# Patient Record
Sex: Female | Born: 1985 | Race: Black or African American | Hispanic: No | Marital: Single | Smoking: Never smoker
Health system: Southern US, Community
[De-identification: ages and names within clinical notes are randomized; demographics above are authoritative.]

## PROBLEM LIST (undated history)

## (undated) ENCOUNTER — Inpatient Hospital Stay (HOSPITAL_COMMUNITY): Payer: Self-pay

## (undated) DIAGNOSIS — Z789 Other specified health status: Secondary | ICD-10-CM

---

## 2007-07-07 ENCOUNTER — Inpatient Hospital Stay (HOSPITAL_COMMUNITY): Admission: AD | Admit: 2007-07-07 | Discharge: 2007-07-11 | Payer: Self-pay | Admitting: Obstetrics & Gynecology

## 2007-07-07 ENCOUNTER — Ambulatory Visit: Payer: Self-pay | Admitting: Family Medicine

## 2007-07-08 ENCOUNTER — Encounter: Payer: Self-pay | Admitting: Family Medicine

## 2007-12-04 ENCOUNTER — Emergency Department (HOSPITAL_COMMUNITY): Admission: EM | Admit: 2007-12-04 | Discharge: 2007-12-04 | Payer: Self-pay | Admitting: Emergency Medicine

## 2007-12-11 ENCOUNTER — Encounter: Payer: Self-pay | Admitting: Obstetrics & Gynecology

## 2007-12-11 ENCOUNTER — Ambulatory Visit: Payer: Self-pay | Admitting: Obstetrics & Gynecology

## 2007-12-12 ENCOUNTER — Ambulatory Visit (HOSPITAL_COMMUNITY): Admission: RE | Admit: 2007-12-12 | Discharge: 2007-12-12 | Payer: Self-pay | Admitting: Gynecology

## 2007-12-18 ENCOUNTER — Ambulatory Visit: Payer: Self-pay | Admitting: Obstetrics & Gynecology

## 2007-12-31 ENCOUNTER — Ambulatory Visit: Payer: Self-pay | Admitting: Obstetrics & Gynecology

## 2008-02-12 ENCOUNTER — Ambulatory Visit: Payer: Self-pay | Admitting: Nurse Practitioner

## 2008-10-22 ENCOUNTER — Emergency Department (HOSPITAL_COMMUNITY): Admission: EM | Admit: 2008-10-22 | Discharge: 2008-10-22 | Payer: Self-pay | Admitting: Emergency Medicine

## 2008-12-07 ENCOUNTER — Emergency Department (HOSPITAL_COMMUNITY): Admission: EM | Admit: 2008-12-07 | Discharge: 2008-12-07 | Payer: Self-pay | Admitting: Emergency Medicine

## 2010-04-07 ENCOUNTER — Encounter: Admission: RE | Admit: 2010-04-07 | Discharge: 2010-04-07 | Payer: Self-pay | Admitting: Infectious Diseases

## 2011-03-13 NOTE — Op Note (Signed)
NAME:  KARALYN, KADEL NO.:  [id number]   MEDICAL RECORD NO.:  [id number]          PATIENT TYPE:  INP   LOCATION:  9122                          FACILITY:  [facility]   PHYSICIAN:  Tanya S. Shawnie Pons, M.D.   DATE OF BIRTH:  Sep 16, 1986   DATE OF PROCEDURE:  07/08/2007  DATE OF DISCHARGE:                               OPERATIVE REPORT   PREOPERATIVE DIAGNOSES:  1. Fetal intolerance of labor.  2. Chorioamnionitis.  3. Prolonged rupture of membranes.   POSTOPERATIVE DIAGNOSES:  1. Fetal intolerance of labor.  2. Chorioamnionitis.  3. Prolonged rupture of membranes.   PROCEDURE:  Primary low transverse cesarean section.   SURGEON:  Shelbie Proctor. Shawnie Pons, M.D.   ASSISTANT:  None.   ANESTHESIA:  Epidural and local with Casimiro Needle A. Malen Gauze, MD.   SPECIMENS:  Placenta to pathology.   ESTIMATED BLOOD LOSS:  1000 mL.   COMPLICATIONS:  None.   FINDINGS:  A viable female infant, Apgars 8 and 9, pH 7.26, weight 6  pounds 13 ounces.   REASON FOR PROCEDURE:  Briefly, the patient is 25 year old gravida 1 who  presented at 40 weeks with rupture of membranes.  She had slow  progression of labor, was started on Pitocin but never really reached  adequate labor, started having some variable decelerations,  amnioinfusion was started.  The patient got to 4-5 cm and then had a  fairly large bradycardia.  At that time she was felt to be 5-6 and after  being on knee-chest, the fetal heart rate recovered.  We had the Pitocin  off for approximately 30 minutes before restarting it at 2 milliunits.  Once Pitocin was restarted she started having severe variables that did  not respond to change in position, fluid bolus or oxygen.  Although  scalp film was still very much there and it was felt that the baby  overall was reassuring, the patient had made some change from 7 to 8, it  was unclear that delivery was imminent and that repetitive variable  decelerations could eventually lead to a nonreassuring  fetal heart rate  tracing, and for this reason a decision was made to proceed with  abdominal delivery.   PROCEDURE:  The patient was taken to the OR.  She was placed in supine  position with a left lateral tilt.  She was then prepped and draped in  the usual sterile fashion.  A Foley catheter was already inside the  bladder.  When anesthesia was felt to be adequate, a knife was used to  make a Pfannenstiel incision through the skin.  This was carried down to  the underlying fascia, which was excised in the midline.  The  subcutaneous tissue and fascial incisions were then extended bluntly,  the rectus divided in the midline, the peritoneal cavity entered  bluntly.  The bladder was noted to be high, probably because the head  was blocking it.  The bladder blade was placed inside the abdomen.  A  low transverse incision was made on the uterus.  The lower uterine  segment was very thin and the infant encountered quickly.  Clear fluid  was noted.  The infant was noted to be in an ROP position.  The infant's  head was brought up and out of the incision.  The infant was bulb-  suctioned and had a spontaneous cry.  The cord was clamped x2 and the  infant taken to awaiting pediatrics.  Cord pH and cord blood were  obtained.  The placenta was manually removed from the uterus, the uterus  cleaned with dry lap pads.  The edges of the uterine incision were  grasped with ring forceps and the uterine incision closed with 0 Vicryl  suture in a locked running fashion.  A second layer of 0 Vicryl in an  imbricating fashion was used to achieve hemostasis.  There was some  bleeding at the corner on the right side of the incision.  A figure-of-  eight was used to achieve hemostasis.  Pressure was held for  approximately 2 minutes and then the incision was reinspected and was  felt to be hemostatic.  Attention was then turned to the fascia, which  was closed with 0 Vicryl suture in a running fashion.  The  subcutaneous  tissue was irrigated and any bleeders cauterized with electrocautery and  the skin closed using clips.  Marcaine 0.25% 30 mL were then injected  about the incision.  A pressure bandage was applied.  All instrument and  lap counts were correct x2.  The patient was awakened and taken to the  recovery room in stable condition.      Shelbie Proctor. Shawnie Pons, M.D.  Electronically Signed     TSP/MEDQ  D:  07/08/2007  T:  07/09/2007  Job:  [id number]

## 2011-03-13 NOTE — Assessment & Plan Note (Signed)
NAME:  Katie Love, ROMBERGER NO.:  [id number]   MEDICAL RECORD NO.:  [id number]          PATIENT TYPE:  POB   LOCATION:  [facility] at [facility]         FACILITY:  [facility]   PHYSICIAN:  Elsie Lincoln, MD      DATE OF BIRTH:  1986-07-13   DATE OF SERVICE:  12/18/2007                                  CLINIC NOTE   The patient is a 25 year old female who is here for follow-up of ovarian  cyst and IUD insertion. Patient is currently on her menstruation.  Her  EDT is negative.  The patient had an ultrasound following her CT that  was abnormal. The right ovary was normal, the left ovary complained a  complex cyst measuring 5.8 x 4.2 x 4.5 cm with evidence of a blood clot  and consistent with a hemorrhagic cyst.  Everything else was normal.  The patient will get another ultrasound in 8 weeks and then come back  for results.  She also wants an IUD today.  She was consented for the  procedure.  Cervix was cleaned and the uterus sounded to 7 cm. The  anterior lip of the cervix grasped with a single-tooth tenaculum and  Mirena IUD was inserted according to product specifications.  The  strings were cut to 2.5 cm.  The patient tolerated procedure well.  The  patient again to come back in 8 weeks after her ultrasound to evaluate  the right ovarian cyst.           ______________________________  Elsie Lincoln, MD     KL/MEDQ  D:  12/18/2007  T:  12/19/2007  Job:  [id number]

## 2011-03-13 NOTE — Assessment & Plan Note (Signed)
NAME:  Katie, Love NO.:  [id number]   MEDICAL RECORD NO.:  [id number]          PATIENT TYPE:  POB   LOCATION:  [facility] at [facility]         FACILITY:  [facility]   PHYSICIAN:  Elsie Lincoln, MD      DATE OF BIRTH:  [birth year]   DATE OF SERVICE:  12/11/2007                                  CLINIC NOTE   Patient is a 25 year old G-1, P, 1 female on November 08, 2007 who is two  days late for her menstrual period.  She presents for physical exam and  also wants a  Mirena.  Her EPT today is negative.  She usually has  normal cycles, however she is breast feeding from her recent birth in  September, so it could be irregular from that.  We will not put a Mirena  in today.  She needs to either have protected sex for two weeks and come  back for a pregnancy test and IUD insertion in two weeks, or she will  need to come in on her menses, which would be better.  The patient has  absolutely no complaints.  She is enjoying being a mother.   PAST MEDICAL HISTORY:  Denies all problems.   PAST SURGICAL HISTORY:  C-sectin.   PAST OBSTETRICAL HISTORY:  C-section x1 and wants an IUD for  contraception.  Has been using condoms until now.   FAMILY HISTORY:  Denies all problems.   SOCIAL HISTORY:  No drinking, drugs or alcohol.   ALLERGIES:  None.   MEDICATIONS:  Flintstones vitamins.   VITAL SIGNS:  Pulse 62, blood pressure 124/84, weight 137.  GENERAL:  Well-nourished, well-developed, no apparent distress.  HEENT:  Normocephalic, atraumatic, good dentition.  Thyroid no masses.  LUNGS:  Clear to auscultation bilaterally.  HEART:  Regular rate and rhythm.  BREASTS:  No masses, nontender, no lymphadenopathy.  ABDOMEN:  Soft, nontender.  No organomegaly, no hernias.  GENITALIA:  Tanner 5.  Vagina pink.  Normal rugae.  Cervix closed,  nontender.  The patient has a left ovarian mass, and at this point she  tells that she did have a visit to the emergency room and they did find  ovarian cyst.  She has apparently a 3 cc cyst on her left adnexa.  RECTUM:  No hemorrhoids.  EXTREMITIES:  Nontender.   ASSESSMENT/PLAN:  [age]-year-old female with well woman check.  1. Pap smear and cultures.  2. Transvaginal ultrasound to evaluate adnexa to see what type of      ovarian cyst she has.  3. Come back with menses or in two weeks for placement of IUD.           ______________________________  Elsie Lincoln, MD     KL/MEDQ  D:  12/11/2007  T:  12/12/2007  Job:  [id number]

## 2011-03-13 NOTE — Assessment & Plan Note (Signed)
NAME:  Katie Love, VETTER NO.:  [id number]   MEDICAL RECORD NO.:  [id number]          PATIENT TYPE:  POB   LOCATION:  [facility]         FACILITY:  [facility]   PHYSICIAN:  Elsie Lincoln, MD      DATE OF BIRTH:  [birth year]   DATE OF SERVICE:  02/12/2008                                  CLINIC NOTE   HISTORY:  The patient is a 25 year old female who presents for follow-up  of hemorrhagic cyst and IUD string check.  She had her IUD placed on  12/18/07 and the strings were cut approximately 2.5 cm.  She also is  noted to have a right probably hemorrhagic cyst.  She did miss her  follow-up ultrasound appointment which was earlier in April and she  would like to go have it rescheduled.  She has no complaints.  Her  infant is doing well.   PHYSICAL EXAMINATION:  Blood pressure 114/75, weight 139, pulse 67.  Vagina pink, normal rugae.  Cervix closed, nontender.  Uterus  anteverted, nontender.  Adnexa no masses, nontender.  Of note, there is  no IUD string seen or felt.   ASSESSMENT/PLAN:  A 26 year old female with right ovarian cyst and lack  of IUD string.  1. Transvaginal ultrasound to follow up cyst and to see if IUD is      placed correctly.  2. The patient up-to-date on PAP smear and is due in February 2010.  3. We will call the patient with the ultrasound results.           ______________________________  Elsie Lincoln, MD     KL/MEDQ  D:  02/12/2008  T:  02/12/2008  Job:  [id number]

## 2011-03-16 NOTE — Discharge Summary (Signed)
NAME:  Katie Love, Katie Love NO.:  [id number]   MEDICAL RECORD NO.:  [id number]          PATIENT TYPE:  INP   LOCATION:  9122                          FACILITY:  [facility]   PHYSICIAN:  Allie Bossier, MD        DATE OF BIRTH:  22-Aug-1986   DATE OF ADMISSION:  07/07/2007  DATE OF DISCHARGE:  07/11/2007                               DISCHARGE SUMMARY   ADMISSION DIAGNOSES:  1. Intrauterine pregnancy at 40 weeks and 1 day gestation.  2. Spontaneous rupture of membranes.   DISCHARGE DIAGNOSIS:  Postoperative day #3 status post primary low-  transverse cesarean section secondary to fetal intolerance of labor and  chorioamnionitis with delivery of viable female infant.   DISCHARGE MEDICATIONS:  1. Colace 100 milligrams orally twice a day as needed constipation.  2. Prenatal vitamins 1 tablet orally daily while breastfeeding.  3. Ibuprofen 600 milligrams orally every 6 hours as needed cramping.  4. Percocet 5/325 milligrams 1 tablet orally every 4 hours as needed      pain dispense #50 with no refills.   PROCEDURES:  Primary low-transverse cesarean section.   PRENATAL LABS:  GBS negative, rubella nonreactive, AB positive blood  type, HIV nonreactive, RPR nonreactive, antibody negative.   HOSPITAL COURSE:  Patient is a 25 year old G1, now G1 P1-0-0-1 who was  admitted at 40 weeks and 1 day gestation with spontaneous rupture of  membranes.  She was admitted to labor and delivery and subsequently was  placed on Pitocin.  Throughout the course of her labor, patient had  intermittent variable decelerations.  Due to concern that the fetus was  not tolerating labor well, in that patient was developing  chorioamnionitis, patient was taken to the operating room for cesarean  section.  She underwent a primary low-transverse cesarean section by Dr.  Shawnie Pons on July 08, 2007, and delivered a viable female infant with  Apgars of 8 at one minute and 9 at five minutes.  Infant's blood pH was  7.26, and it weighed 6 pounds 13 ounces.  Patient had estimated blood  loss of 1000 milliliters.  She tolerated the procedure well and had an  uneventful postoperative course.  Prior to discharge, she had both  ambulated and tolerated oral intake well.  She also had a bowel  movement.  Her pain was well controlled with Percocet.  Patient plans on  breastfeeding and received Depo-Provera for contraception.  Her Depo-  Provera injection was on July 11, 2007.  On postoperative day #3,  patient's staples were removed, and she was deemed stable for discharge.   DISCHARGE INSTRUCTIONS:  1. Nothing in vagina times 6 weeks.  2. No heavy lifting times 6 weeks.  3. Take medications as previously outlined above.  4. Follow up at the [facility] Department in 6 weeks for      postpartum check.   DISCHARGE CONDITION:  Patient was discharged in stable condition to home  with her infant.      Lauro Franklin, MD      Allie Bossier, MD  Electronically Signed    TCB/MEDQ  D:  07/12/2007  T:  07/12/2007  Job:  [id number]

## 2011-03-20 ENCOUNTER — Other Ambulatory Visit: Payer: Self-pay | Admitting: Obstetrics & Gynecology

## 2011-03-20 ENCOUNTER — Ambulatory Visit (INDEPENDENT_AMBULATORY_CARE_PROVIDER_SITE_OTHER): Payer: BC Managed Care – PPO | Admitting: Obstetrics & Gynecology

## 2011-03-20 DIAGNOSIS — Z113 Encounter for screening for infections with a predominantly sexual mode of transmission: Secondary | ICD-10-CM

## 2011-03-20 DIAGNOSIS — Z1272 Encounter for screening for malignant neoplasm of vagina: Secondary | ICD-10-CM

## 2011-03-20 DIAGNOSIS — N926 Irregular menstruation, unspecified: Secondary | ICD-10-CM

## 2011-03-20 DIAGNOSIS — N946 Dysmenorrhea, unspecified: Secondary | ICD-10-CM

## 2011-03-20 DIAGNOSIS — Z01419 Encounter for gynecological examination (general) (routine) without abnormal findings: Secondary | ICD-10-CM

## 2011-03-22 NOTE — Assessment & Plan Note (Signed)
NAME:  Katie Love, Katie Love NO.:  [id number]  MEDICAL RECORD NO.:  [id number]           PATIENT TYPE:  LOCATION:  [facility] at [place]           FACILITY:  PHYSICIAN:  Elsie Lincoln, MD           DATE OF BIRTH:  DATE OF SERVICE:  03/20/2011                                 CLINIC NOTE  The patient is a 25 year old G1, P50 female who presents for her yearly exam.  I have seen her at [facility] office but that was in 2009.  The patient lost insurance and she went back and got her LPN.  She works in [employer] which is on [address].  The patient had an IUD placed, but the strings are now gone.  She also has a history of an ovarian cyst that appears to be mostly hemorrhagic on the left ovary.  Again, she did not get followup due to insurance issues.  The patient is complaining of dyspareunia for the past 3 months.  It is mostly with deep penetration and also sometimes about halfway in.  It does not hurt insertionally. The patient also will get a foul discharge occasionally and uses Vagisil to clear it up.  PAST MEDICAL HISTORY:  Denies all problems.  PAST SURGICAL HISTORY:  C-section.  OBSTETRICAL HISTORY:  C-section x1 with an IUD in situ.  The patient questionably had an abnormal Pap smear and had a colpo, but never had any treatment.  She said it cleared up.  There is no history of endometriosis or fibroid tumors.  There is a history of ovarian cyst on the left back in 2009.  No history of sexually transmitted diseases.  MEDICATIONS:  None.  ALLERGIES:  None.  SOCIAL HISTORY:  She is employed as described above.  FAMILY HISTORY:  Negative for diabetes, heart disease, high blood pressure, breast cancer, colon cancer, ovarian cancer, uterine cancer, blood clot in the legs or lungs.  REVIEW OF SYSTEMS:  Systemic review is positive for vaginal discharge and pain with intercourse.  PHYSICAL EXAMINATION:  VITAL SIGNS:  Pulse 82, blood pressure  125/77, weight 141, height 61 inches. GENERAL:  Well nourished, well developed no apparent distress. HEENT:  Normocephalic, atraumatic.  Good dentition. THYROID AND NECK:  No masses. LUNGS:  Clear auscultation bilaterally. HEART:  Regular rate and rhythm. BREASTS:  No masses, nontender.  No lymphadenopathy.  No nipple discharge. ABDOMEN:  Soft, nontender.  No rebound or guarding.  No organomegaly. No hernia.  GENITALIA:  Tanner V.  Vagina pink, normal rugae.  Cervix closed, nontender.  No IUD strings seen.  Uterus feels anteverted.  The ovaries are not well felt due to good musculature and abdominal wall, but no major mass felt either.  The exam is nontender.  We also did a Q- tip exam at the vulva and there was no pain on the vulva or the vestibule.  No cystocele, no rectocele.  No hemorrhoids. EXTREMITIES:  Nontender.  ASSESSMENT/PLAN:  A 25 year old female for well-woman exam and also complaining of dyspareunia and occasional vaginal discharge. 1. Pap smear done. 2. GC, Chlamydia and wet prep done. 3. EPT for oligomenorrhea, most likely due to the IUD. 4. Transvaginal ultrasound ordered to  evaluate a history of left     ovarian cyst and IUD placement, also evaluating the other pelvic     pathology. 5. No more use of Vagisil. 6. Return to clinic in 2-3 weeks for results. 7. Reviewed using lubricant at all times because she does not     lubricate on her own.  The patient was also told to stop having sex     when became painful.  Her husband now knows it is painful and he is     going to stop.          ______________________________ Elsie Lincoln, MD    KL/MEDQ  D:  03/20/2011  T:  03/21/2011  Job:  [id number]

## 2011-05-04 ENCOUNTER — Other Ambulatory Visit: Payer: Self-pay | Admitting: Obstetrics & Gynecology

## 2011-05-04 DIAGNOSIS — Z975 Presence of (intrauterine) contraceptive device: Secondary | ICD-10-CM

## 2011-05-04 DIAGNOSIS — N83209 Unspecified ovarian cyst, unspecified side: Secondary | ICD-10-CM

## 2011-05-07 ENCOUNTER — Other Ambulatory Visit: Payer: BC Managed Care – PPO

## 2011-05-14 ENCOUNTER — Ambulatory Visit
Admission: RE | Admit: 2011-05-14 | Discharge: 2011-05-14 | Disposition: A | Discharge status: Home / Self Care | Payer: BC Managed Care – PPO | Source: Ambulatory Visit | Attending: Obstetrics & Gynecology | Admitting: Obstetrics & Gynecology

## 2011-05-14 DIAGNOSIS — Z975 Presence of (intrauterine) contraceptive device: Secondary | ICD-10-CM

## 2011-05-14 DIAGNOSIS — N83209 Unspecified ovarian cyst, unspecified side: Secondary | ICD-10-CM

## 2011-07-20 LAB — I-STAT 8, (EC8 V) (CONVERTED LAB)
BUN: 9
Bicarbonate: 25.2 — ABNORMAL HIGH
Glucose, Bld: 89
HCT: 42
Operator id: 294501
Potassium: 3.9
Sodium: 137
TCO2: 26
pCO2, Ven: 40 — ABNORMAL LOW
pH, Ven: 7.408 — ABNORMAL HIGH

## 2011-07-20 LAB — DIFFERENTIAL
Eosinophils Absolute: 0.1
Eosinophils Relative: 2
Neutrophils Relative %: 37 — ABNORMAL LOW

## 2011-07-20 LAB — LIPASE, BLOOD: Lipase: 32

## 2011-07-20 LAB — CBC
HCT: 38.4
MCV: 90
RBC: 4.26
WBC: 5.2

## 2011-07-20 LAB — HEPATIC FUNCTION PANEL
AST: 18
Albumin: 4.1
Alkaline Phosphatase: 69
Total Bilirubin: 1.1

## 2011-07-20 LAB — URINALYSIS, ROUTINE W REFLEX MICROSCOPIC
Bilirubin Urine: NEGATIVE
Hgb urine dipstick: NEGATIVE
Specific Gravity, Urine: 1.02
pH: 6

## 2011-07-20 LAB — URINE MICROSCOPIC-ADD ON

## 2011-08-10 LAB — URINE MICROSCOPIC-ADD ON

## 2011-08-10 LAB — CBC
MCHC: 34.5
MCV: 92.6
Platelets: 196
RDW: 13.3
WBC: 18.3 — ABNORMAL HIGH

## 2011-08-10 LAB — URINALYSIS, ROUTINE W REFLEX MICROSCOPIC
Ketones, ur: NEGATIVE
Leukocytes, UA: NEGATIVE
Specific Gravity, Urine: 1.01
pH: 7

## 2011-08-10 LAB — RPR: RPR Ser Ql: NONREACTIVE

## 2011-09-12 ENCOUNTER — Ambulatory Visit (INDEPENDENT_AMBULATORY_CARE_PROVIDER_SITE_OTHER): Payer: BC Managed Care – PPO | Admitting: Obstetrics & Gynecology

## 2011-09-12 ENCOUNTER — Encounter: Payer: Self-pay | Admitting: Obstetrics & Gynecology

## 2011-09-12 VITALS — BP 115/78 | HR 59 | Temp 98.0°F | Resp 15 | Ht 61.0 in | Wt 143.0 lb

## 2011-09-12 DIAGNOSIS — N898 Other specified noninflammatory disorders of vagina: Secondary | ICD-10-CM

## 2011-09-12 DIAGNOSIS — IMO0001 Reserved for inherently not codable concepts without codable children: Secondary | ICD-10-CM | POA: Insufficient documentation

## 2011-09-12 MED ORDER — FLUCONAZOLE 100 MG PO TABS: 150.0000 mg | ORAL_TABLET | Freq: Every day | ORAL | Status: AC

## 2011-09-12 NOTE — Progress Notes (Signed)
  Subjective:    Patient ID: Katie Love, female    DOB: 04-08-1986, 25 y.o.   MRN: [id number]  HPI Pt has been having vaginal itching and burning for a few days.  Pt tried an over the counter ([facility] brand) of Vagisil type cream.  Pt has no complaints of irreg bleeding or pelvic pain.  Pt wants to have Mirena out at some point b/c she thinks it is contributing to her vaginal symptoms   Review of Systems  Constitutional: Negative.   Gastrointestinal: Negative.   Genitourinary: Negative for dysuria.       Vaginal burining / itching       Objective:   Physical Exam  Constitutional: She appears well-developed and well-nourished. No distress.  HENT:  Head: Normocephalic and atraumatic.  Eyes: Conjunctivae are normal.  Genitourinary: Uterus normal. Vaginal discharge found.       Minimal amount of discharge IUD strings not seen, but IUD in place on recent US  Skin: Skin is warm and dry.  Psychiatric: She has a normal mood and affect.          Assessment & Plan:  25 yo female with presumed vaginal candidiasis  1-treat with diflucan 2-wet prep sent 3-RTC prn or for IUD removal

## 2011-09-13 ENCOUNTER — Other Ambulatory Visit: Payer: Self-pay | Admitting: Obstetrics & Gynecology

## 2011-09-13 LAB — WET PREP FOR TRICH, YEAST, CLUE: WBC, Wet Prep HPF POC: NONE SEEN

## 2011-09-13 LAB — WET PREP, GENITAL: Trich, Wet Prep: NONE SEEN

## 2011-09-16 ENCOUNTER — Other Ambulatory Visit: Payer: Self-pay | Admitting: Obstetrics & Gynecology

## 2011-09-16 MED ORDER — METRONIDAZOLE 500 MG PO TABS: 500.0000 mg | ORAL_TABLET | Freq: Two times a day (BID) | ORAL | Status: AC

## 2011-09-17 NOTE — Progress Notes (Signed)
LM on home number that her wet prep showed BV and she had a RX @ her pharmacy for Flagyl 500mg  BID x 7 days.  This was escribed by Dr Penne Lash.

## 2011-09-26 ENCOUNTER — Ambulatory Visit: Payer: BC Managed Care – PPO | Admitting: Obstetrics & Gynecology

## 2011-10-03 ENCOUNTER — Encounter: Payer: Self-pay | Admitting: Obstetrics & Gynecology

## 2011-10-03 ENCOUNTER — Ambulatory Visit (INDEPENDENT_AMBULATORY_CARE_PROVIDER_SITE_OTHER): Payer: BC Managed Care – PPO | Admitting: Obstetrics & Gynecology

## 2011-10-03 VITALS — BP 118/81 | HR 56 | Temp 97.6°F | Resp 16 | Ht 61.0 in | Wt 145.0 lb

## 2011-10-03 DIAGNOSIS — N898 Other specified noninflammatory disorders of vagina: Secondary | ICD-10-CM

## 2011-10-03 DIAGNOSIS — N899 Noninflammatory disorder of vagina, unspecified: Secondary | ICD-10-CM

## 2011-10-03 MED ORDER — CLOBETASOL PROPIONATE 0.05 % EX OINT: TOPICAL_OINTMENT | Freq: Two times a day (BID) | CUTANEOUS | Status: DC

## 2011-10-03 MED ORDER — FLUCONAZOLE 100 MG PO TABS: 150.0000 mg | ORAL_TABLET | Freq: Every day | ORAL | Status: AC

## 2011-10-03 NOTE — Progress Notes (Signed)
  Subjective:    Patient ID: Katie Love, female    DOB: September 07, 1986, 25 y.o.   MRN: [id number]  HPI  Pt complaining of continued itching on vulva.  Mostly labia minora.  Pt uses Suave soap, Tide detergent, and KY jelly.  Pt got some relief from diflucan.  No relief from flagyl.  Pt thinks it is the Mirena that is causing the itching  Review of Systems  Respiratory: Negative.   Cardiovascular: Negative.   Gastrointestinal: Negative.   Genitourinary:       Vaginal itching       Objective:   Physical Exam  Constitutional: She is oriented to person, place, and time. She appears well-developed and well-nourished. No distress.  HENT:  Head: Normocephalic and atraumatic.  Abdominal: Soft.  Genitourinary: Vagina normal.  Neurological: She is alert and oriented to person, place, and time.  Skin: Skin is warm and dry.  Psychiatric: She has a normal mood and affect.          Assessment & Plan:  Vaginal itching  Will treat with clobetasol ointment bid for a week then daily for a week. Diflucan x1 Cetaphil soap Change lubricant to astroglide Dye free and perfume free detergent.  RTC 2 weeks.  If not better may remove IUD

## 2011-10-03 NOTE — Patient Instructions (Signed)
Use Astroglide for lubricant  Wash with Cetaphil soap.  Change detergent to Tide Free  No douching or other creams other than those prescribed by the MD.

## 2011-10-04 LAB — WET PREP BY MOLECULAR PROBE
Candida species: POSITIVE — AB
Trichomonas vaginosis: NEGATIVE

## 2011-10-10 ENCOUNTER — Telehealth: Payer: Self-pay | Admitting: *Deleted

## 2011-10-10 NOTE — Telephone Encounter (Signed)
Left message to inform pt that she did have vaginal yeast and to see if she was feeling better after taking the Diflucan and that if she wasn't we would retreat her per Dr Penne Lash.

## 2011-11-19 ENCOUNTER — Other Ambulatory Visit: Payer: Self-pay | Admitting: Obstetrics & Gynecology

## 2011-11-19 ENCOUNTER — Telehealth: Payer: Self-pay

## 2011-11-19 MED ORDER — FLUCONAZOLE 100 MG PO TABS: 150.0000 mg | ORAL_TABLET | Freq: Every day | ORAL | Status: AC

## 2012-01-09 ENCOUNTER — Other Ambulatory Visit: Payer: Self-pay | Admitting: Obstetrics & Gynecology

## 2012-01-09 MED ORDER — ATOVAQUONE-PROGUANIL HCL 250-100 MG PO TABS: 1.0000 | ORAL_TABLET | Freq: Every day | ORAL | Status: DC

## 2012-03-25 ENCOUNTER — Encounter: Payer: Self-pay | Admitting: Obstetrics & Gynecology

## 2012-03-25 ENCOUNTER — Ambulatory Visit (INDEPENDENT_AMBULATORY_CARE_PROVIDER_SITE_OTHER): Payer: BC Managed Care – PPO | Admitting: Obstetrics & Gynecology

## 2012-03-25 VITALS — BP 118/78 | HR 63 | Ht 62.0 in | Wt 145.0 lb

## 2012-03-25 DIAGNOSIS — Z30432 Encounter for removal of intrauterine contraceptive device: Secondary | ICD-10-CM

## 2012-03-25 DIAGNOSIS — N760 Acute vaginitis: Secondary | ICD-10-CM | POA: Insufficient documentation

## 2012-03-25 NOTE — Progress Notes (Signed)
Patient is here to have Mirena IUD removed due to increase in vaginal infections.

## 2012-03-25 NOTE — Progress Notes (Signed)
  Subjective:    Patient ID: Katie Love, female    DOB: [birth year], 26 y.o.   MRN: [id number]  HPI  Pt presents for IUD removal.  Pt thinks it is causing vaginitis.  Pt is OK with becoming pregnant.  Pt has one child and he is having speech delay.  Review of Systems  Gastrointestinal: Negative.   Genitourinary: Negative.        Objective:   Physical Exam  Constitutional: She appears well-developed and well-nourished.  HENT:  Head: Normocephalic and atraumatic.  Pulmonary/Chest: Effort normal.  Abdominal: She exhibits no distension and no mass. There is no tenderness. There is no rebound and no guarding.  Genitourinary: Vagina normal and uterus normal.  Skin: Skin is warm and dry.  Psychiatric: She has a normal mood and affect.          Assessment & Plan:  IUD removal  Consent obtained. No strings identified. IUD confirmed with bedside US Tresa Endo used to grasp strings just inside the os and IUD removed without problem Pap smear up to date (cytology and HPV neg). Needs primary care MD--Dr. Ruthe Mannan recommended. RTC prn

## 2012-03-25 NOTE — Patient Instructions (Signed)
Contraception Choices Contraception (birth control) is the use of any methods or devices to prevent pregnancy. Below are some methods to help avoid pregnancy. HORMONAL METHODS   Contraceptive implant. This is a thin, plastic tube containing progesterone hormone. It does not contain estrogen hormone. Your caregiver inserts the tube in the inner part of the upper arm. The tube can remain in place for up to 3 years. After 3 years, the implant must be removed. The implant prevents the ovaries from releasing an egg (ovulation), thickens the cervical mucus which prevents sperm from entering the uterus, and thins the lining of the inside of the uterus.   Progesterone-only injections. These injections are given every 3 months by your caregiver to prevent pregnancy. This synthetic progesterone hormone stops the ovaries from releasing eggs. It also thickens cervical mucus and changes the uterine lining. This makes it harder for sperm to survive in the uterus.   Birth control pills. These pills contain estrogen and progesterone hormone. They work by stopping the egg from forming in the ovary (ovulation). Birth control pills are prescribed by a caregiver.Birth control pills can also be used to treat heavy periods.   Minipill. This type of birth control pill contains only the progesterone hormone. They are taken every day of each month and must be prescribed by your caregiver.   Birth control patch. The patch contains hormones similar to those in birth control pills. It must be changed once a week and is prescribed by a caregiver.   Vaginal ring. The ring contains hormones similar to those in birth control pills. It is left in the vagina for 3 weeks, removed for 1 week, and then a new one is put back in place. The patient must be comfortable inserting and removing the ring from the vagina.A caregiver's prescription is necessary.   Emergency contraception. Emergency contraceptives prevent pregnancy after  unprotected sexual intercourse. This pill can be taken right after sex or up to 5 days after unprotected sex. It is most effective the sooner you take the pills after having sexual intercourse. Emergency contraceptive pills are available without a prescription. Check with your pharmacist. Do not use emergency contraception as your only form of birth control.  BARRIER METHODS   Female condom. This is a thin sheath (latex or rubber) that is worn over the penis during sexual intercourse. It can be used with spermicide to increase effectiveness.   Female condom. This is a soft, loose-fitting sheath that is put into the vagina before sexual intercourse.   Diaphragm. This is a soft, latex, dome-shaped barrier that must be fitted by a caregiver. It is inserted into the vagina, along with a spermicidal jelly. It is inserted before intercourse. The diaphragm should be left in the vagina for 6 to 8 hours after intercourse.   Cervical cap. This is a round, soft, latex or plastic cup that fits over the cervix and must be fitted by a caregiver. The cap can be left in place for up to 48 hours after intercourse.   Sponge. This is a soft, circular piece of polyurethane foam. The sponge has spermicide in it. It is inserted into the vagina after wetting it and before sexual intercourse.   Spermicides. These are chemicals that kill or block sperm from entering the cervix and uterus. They come in the form of creams, jellies, suppositories, foam, or tablets. They do not require a prescription. They are inserted into the vagina with an applicator before having sexual intercourse. The process must be   repeated every time you have sexual intercourse.  INTRAUTERINE CONTRACEPTION  Intrauterine device (IUD). This is a T-shaped device that is put in a woman's uterus during a menstrual period to prevent pregnancy. There are 2 types:   Copper IUD. This type of IUD is wrapped in copper wire and is placed inside the uterus. Copper  makes the uterus and fallopian tubes produce a fluid that kills sperm. It can stay in place for 10 years.   Hormone IUD. This type of IUD contains the hormone progestin (synthetic progesterone). The hormone thickens the cervical mucus and prevents sperm from entering the uterus, and it also thins the uterine lining to prevent implantation of a fertilized egg. The hormone can weaken or kill the sperm that get into the uterus. It can stay in place for 5 years.  PERMANENT METHODS OF CONTRACEPTION  Female tubal ligation. This is when the woman's fallopian tubes are surgically sealed, tied, or blocked to prevent the egg from traveling to the uterus.   Female sterilization. This is when the female has the tubes that carry sperm tied off (vasectomy).This blocks sperm from entering the vagina during sexual intercourse. After the procedure, the man can still ejaculate fluid (semen).  NATURAL PLANNING METHODS  Natural family planning. This is not having sexual intercourse or using a barrier method (condom, diaphragm, cervical cap) on days the woman could become pregnant.   Calendar method. This is keeping track of the length of each menstrual cycle and identifying when you are fertile.   Ovulation method. This is avoiding sexual intercourse during ovulation.   Symptothermal method. This is avoiding sexual intercourse during ovulation, using a thermometer and ovulation symptoms.   Post-ovulation method. This is timing sexual intercourse after you have ovulated.  Regardless of which type or method of contraception you choose, it is important that you use condoms to protect against the transmission of sexually transmitted diseases (STDs). Talk with your caregiver about which form of contraception is most appropriate for you. Document Released: 10/15/2005 Document Revised: 10/04/2011 Document Reviewed: 02/21/2011 Exit[employer] Patient Information 2012 [employer]. 

## 2012-05-15 ENCOUNTER — Ambulatory Visit: Payer: BC Managed Care – PPO | Admitting: Obstetrics & Gynecology

## 2012-05-21 ENCOUNTER — Encounter: Payer: Self-pay | Admitting: Obstetrics & Gynecology

## 2012-05-21 ENCOUNTER — Ambulatory Visit (INDEPENDENT_AMBULATORY_CARE_PROVIDER_SITE_OTHER): Payer: BC Managed Care – PPO | Admitting: Obstetrics & Gynecology

## 2012-05-21 VITALS — BP 104/65 | HR 62 | Temp 98.5°F | Resp 16 | Ht 61.0 in | Wt 147.0 lb

## 2012-05-21 DIAGNOSIS — N912 Amenorrhea, unspecified: Secondary | ICD-10-CM

## 2012-05-21 DIAGNOSIS — N760 Acute vaginitis: Secondary | ICD-10-CM

## 2012-05-21 NOTE — Progress Notes (Signed)
  Subjective:    Patient ID: Katie Love, female    DOB: July 20, 1986, 26 y.o.   MRN: [id number]  HPI  Pt had IUD remove the end of May.  Pt has been having unprotected sex.  UPT positive.  Review of Systems Pt denies bleeding, abdominal pain. Pt does have symptoms of GERD     Objective:   Physical Exam  Bedside US done showing IUP at 6 weeks 4 days.   Pt needs new OB and will schedule first screen and have official US at that time     Assessment & Plan:  26 yo female with 6 week 4 day IUP Continue folic acid F/u 3-4 weeks.

## 2012-05-23 ENCOUNTER — Telehealth: Payer: Self-pay | Admitting: *Deleted

## 2012-05-23 NOTE — Telephone Encounter (Signed)
Pt called and left message on voice mail that she is bleeding.  Pt is pregnant and had a confirmed IUP by Dr Penne Lash by bedside U/S on Wed.  Returned call to pt and advised if she is having light spotting or brown when she wipes that this could be normal but if she is bleeding like a period she should go to the MAU for evaluation.  We do not have her blood type.  This was left on her phone voicemail.

## 2012-05-26 ENCOUNTER — Inpatient Hospital Stay (HOSPITAL_COMMUNITY)
Admission: AD | Admit: 2012-05-26 | Discharge: 2012-05-26 | Disposition: A | Discharge status: Hospice | Payer: BC Managed Care – PPO | Source: Ambulatory Visit | Attending: Family Medicine | Admitting: Family Medicine

## 2012-05-26 ENCOUNTER — Inpatient Hospital Stay (HOSPITAL_COMMUNITY): Payer: BC Managed Care – PPO

## 2012-05-26 ENCOUNTER — Encounter (HOSPITAL_COMMUNITY): Payer: Self-pay | Admitting: *Deleted

## 2012-05-26 DIAGNOSIS — O209 Hemorrhage in early pregnancy, unspecified: Secondary | ICD-10-CM | POA: Insufficient documentation

## 2012-05-26 HISTORY — DX: Other specified health status: Z78.9

## 2012-05-26 LAB — CBC WITH DIFFERENTIAL/PLATELET
Hemoglobin: 11.2 g/dL — ABNORMAL LOW (ref 12.0–15.0)
Lymphocytes Relative: 45 % (ref 12–46)
Lymphs Abs: 2.7 10*3/uL (ref 0.7–4.0)
MCH: 30 pg (ref 26.0–34.0)
Monocytes Relative: 7 % (ref 3–12)
Neutro Abs: 2.9 10*3/uL (ref 1.7–7.7)
Neutrophils Relative %: 47 % (ref 43–77)
RBC: 3.73 MIL/uL — ABNORMAL LOW (ref 3.87–5.11)
WBC: 6.1 10*3/uL (ref 4.0–10.5)

## 2012-05-26 LAB — OB RESULTS CONSOLE ABO/RH

## 2012-05-26 LAB — ABO/RH: ABO/RH(D): AB POS

## 2012-05-26 LAB — WET PREP, GENITAL

## 2012-05-26 NOTE — MAU Note (Signed)
Pt states she has pink/red bleeding since last Thursday.  Today is brown discharge.  She says she had intermittant cramping, but none now.

## 2012-05-26 NOTE — MAU Provider Note (Signed)
History     CSN: [id number]  Arrival date and time: 05/26/12 1616   First Provider Initiated Contact with Patient 05/26/12 1853      Chief Complaint  Patient presents with  . Vaginal Bleeding   HPI Katie Love is a 26 y.o. female who presents to MAU for vaginal bleeding in early pregnancy. LMP unsure. Had been using marina IUD for birth control. Was placed 4 years ago by Dr. Penne Lash. Removed 5/28 and has not had a period. Started bleeding 3 days ago. Describes the bleeding as less than a period. Associated symptoms include lower abdominal cramping and nausea. 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 The history was provided by the patient.  OB History    Grav Para Term Preterm Abortions TAB SAB Ect Mult Living   2 1 1       1       Past Medical History  Diagnosis Date  . No pertinent past medical history     Past Surgical History  Procedure Date  . Cesarean section 2008    History reviewed. No pertinent family history.  History  Substance Use Topics  . Smoking status: Never Smoker   . Smokeless tobacco: Never Used  . Alcohol Use: No    Allergies: No Known Allergies  Prescriptions prior to admission  Medication Sig Dispense Refill  . folic acid (FOLVITE) 1 MG tablet Take 1 mg by mouth daily.        Review of Systems  Constitutional: Negative for fever, chills and weight loss.  HENT: Negative for ear pain, nosebleeds, congestion, sore throat and neck pain.   Eyes: Negative for blurred vision, double vision, photophobia and pain.  Respiratory: Negative for cough, shortness of breath and wheezing.   Cardiovascular: Negative for chest pain, palpitations and leg swelling.  Gastrointestinal: Positive for nausea, vomiting and abdominal pain. Negative for heartburn, diarrhea and constipation.  Genitourinary: Positive for frequency. Negative for dysuria and urgency.  Musculoskeletal: Negative for myalgias and back pain.  Skin:  Negative for itching and rash.  Neurological: Positive for dizziness and headaches. Negative for sensory change, speech change, seizures and weakness.  Endo/Heme/Allergies: Does not bruise/bleed easily.  Psychiatric/Behavioral: Negative for depression. The patient is not nervous/anxious.    Physical Exam   Blood pressure 102/63, pulse 60, temperature 98.5 F (36.9 C), temperature source Oral, resp. rate 18, height 5' 2.5" (1.588 m), weight 148 lb (67.132 kg).  Physical Exam  Nursing note and vitals reviewed. Constitutional: She is oriented to person, place, and time. She appears well-developed and well-nourished. No distress.  HENT:  Head: Normocephalic and atraumatic.  Eyes: EOM are normal.  Neck: Neck supple.  Cardiovascular: Normal rate.   Respiratory: Effort normal.  GI: Soft. There is no tenderness.  Genitourinary:       External genitalia without lesions. Scant blood vaginal vault. Cervix long, closed. No CMT, no adnexal tenderness or mass palpated. Uterus slightly enlarged.   Musculoskeletal: Normal range of motion.  Neurological: She is alert and oriented to person, place, and time.  Skin: Skin is warm and dry.  Psychiatric: She has a normal mood and affect. Her behavior is normal. Judgment and thought content normal.   Results for orders placed during the hospital encounter of 05/26/12 (from the past 24 hour(s))  HCG, QUANTITATIVE, PREGNANCY     Status: Abnormal   Collection Time   05/26/12  5:18 PM      Component Value Range  hCG, Beta [place], [place] [address] (*) <5 mIU/mL  CBC WITH DIFFERENTIAL     Status: Abnormal   Collection Time   05/26/12  5:19 PM      Component Value Range   WBC 6.1  4.0 - 10.5 K/uL   RBC 3.73 (*) 3.87 - 5.11 MIL/uL   Hemoglobin 11.2 (*) 12.0 - 15.0 g/dL   HCT 60.4 (*) 54.0 - 98.1 %   MCV 88.2  78.0 - 100.0 fL   MCH 30.0  26.0 - 34.0 pg   MCHC 34.0  30.0 - 36.0 g/dL   RDW 19.1  47.8 - 29.5 %   Platelets 239  150 - 400 K/uL   Neutrophils  Relative 47  43 - 77 %   Neutro Abs 2.9  1.7 - 7.7 K/uL   Lymphocytes Relative 45  12 - 46 %   Lymphs Abs 2.7  0.7 - 4.0 K/uL   Monocytes Relative 7  3 - 12 %   Monocytes Absolute 0.4  0.1 - 1.0 K/uL   Eosinophils Relative 1  0 - 5 %   Eosinophils Absolute 0.1  0.0 - 0.7 K/uL   Basophils Relative 0  0 - 1 %   Basophils Absolute 0.0  0.0 - 0.1 K/uL  ABO/RH     Status: Normal   Collection Time   05/26/12  5:19 PM      Component Value Range   ABO/RH(D) AB POS    WET PREP, GENITAL     Status: Abnormal   Collection Time   05/26/12  7:00 PM      Component Value Range   Yeast Wet Prep HPF POC NONE SEEN  NONE SEEN   Trich, Wet Prep NONE SEEN  NONE SEEN   Clue Cells Wet Prep HPF POC FEW (*) NONE SEEN   WBC, Wet Prep HPF POC FEW (*) NONE SEEN   MAU Course  Procedures  NEESE,HOPE 05/26/2012, 8:04 PM  Care turned over to [facility], CNM @ 20:00 pm patient awaiting ultrasound.   Assessment/Plan: Viable IUP on Ultrasound  Discharge home Follow-up with [facility] as scheduled to begin [facility]  Alise Calais E. 8:51 PM

## 2012-05-26 NOTE — MAU Note (Signed)
Been bleeding since Friday.  Has been bleeding with any type of activity. Feel dizzy

## 2012-05-26 NOTE — MAU Note (Signed)
Had IUD removed 05/28- no period since

## 2012-05-27 LAB — GC/CHLAMYDIA PROBE AMP, GENITAL: GC Probe Amp, Genital: NEGATIVE

## 2012-05-27 NOTE — MAU Provider Note (Signed)
Chart reviewed and agree with management and plan.  

## 2012-06-18 ENCOUNTER — Encounter: Payer: Self-pay | Admitting: Obstetrics & Gynecology

## 2012-06-18 ENCOUNTER — Ambulatory Visit: Payer: BC Managed Care – PPO | Admitting: Obstetrics & Gynecology

## 2012-06-18 ENCOUNTER — Ambulatory Visit (INDEPENDENT_AMBULATORY_CARE_PROVIDER_SITE_OTHER): Payer: BC Managed Care – PPO | Admitting: Obstetrics & Gynecology

## 2012-06-18 VITALS — BP 97/61 | Temp 98.4°F | Wt 142.0 lb

## 2012-06-18 DIAGNOSIS — Z3480 Encounter for supervision of other normal pregnancy, unspecified trimester: Secondary | ICD-10-CM

## 2012-06-18 DIAGNOSIS — Z3490 Encounter for supervision of normal pregnancy, unspecified, unspecified trimester: Secondary | ICD-10-CM

## 2012-06-18 MED ORDER — ONDANSETRON HCL 4 MG PO TABS: 4.0000 mg | ORAL_TABLET | Freq: Three times a day (TID) | ORAL | Status: AC | PRN

## 2012-06-18 NOTE — Progress Notes (Signed)
p-70  Vaginal discharge with odor

## 2012-06-18 NOTE — Progress Notes (Signed)
   Subjective:    Katie Love is a G2P1001 [redacted]w[redacted]d being seen today for her first obstetrical visit.  Her obstetrical history is significant for prior c/s. Patient does intend to breast feed. Pregnancy history fully reviewed.  Patient reports nausea.  Filed Vitals:   06/18/12 1056  BP: 97/61  Temp: 98.4 F (36.9 C)  Weight: 64.411 kg (142 lb)    HISTORY: OB History    Grav Para Term Preterm Abortions TAB SAB Ect Mult Living   2 1 1       1      # Outc Date GA Lbr Len/2nd Wgt Sex Del Anes PTL Lv   1 TRM     M LTCS   Yes   2 CUR              Past Medical History  Diagnosis Date  . No pertinent past medical history    Past Surgical History  Procedure Date  . Cesarean section 2008   History reviewed. No pertinent family history.   Exam    Uterus:     Pelvic Exam:    Perineum: No Hemorrhoids   Vulva: normal   Vagina:  normal mucosa   pH: n/a   Cervix: no cervical motion tenderness   Adnexa: normal adnexa   Bony Pelvis: average  System: Breast:  normal appearance, no masses or tenderness   Skin: normal coloration and turgor, no rashes    Neurologic: oriented, normal mood   Extremities: normal strength, tone, and muscle mass   HEENT oropharynx clear, no lesions and neck supple with midline trachea   Mouth/Teeth mucous membranes moist, pharynx normal without lesions and dental hygiene good   Neck supple   Cardiovascular: regular rate and rhythm   Respiratory:  appears well, vitals normal, no respiratory distress, acyanotic, normal RR, chest clear, no wheezing, crepitations, rhonchi, normal symmetric air entry   Abdomen: soft, non-tender; bowel sounds normal; no masses,  no organomegaly   Urinary: urethral meatus normal      Assessment:    Pregnancy: G2P1001 Patient Active Problem List  Diagnosis  . Recurrent vaginitis        Plan:     Initial labs drawn. Prenatal vitamins. Problem list reviewed and updated. Genetic Screening discussed First  Screen: ordered.  Ultrasound discussed; fetal survey: Will order for 20 weeks.  Follow up in 4 weeks. Zofran for nausea Weight gain 25-35 pounds    Katie Love H. 06/18/2012

## 2012-06-19 LAB — HIV ANTIBODY (ROUTINE TESTING W REFLEX): HIV: NONREACTIVE

## 2012-06-19 LAB — RPR

## 2012-06-20 LAB — CULTURE, OB URINE
Colony Count: NO GROWTH
Organism ID, Bacteria: NO GROWTH

## 2012-07-17 ENCOUNTER — Ambulatory Visit (INDEPENDENT_AMBULATORY_CARE_PROVIDER_SITE_OTHER): Payer: BC Managed Care – PPO | Admitting: Obstetrics & Gynecology

## 2012-07-17 VITALS — BP 98/63 | Wt 147.0 lb

## 2012-07-17 DIAGNOSIS — Z3490 Encounter for supervision of normal pregnancy, unspecified, unspecified trimester: Secondary | ICD-10-CM

## 2012-07-17 DIAGNOSIS — Z3480 Encounter for supervision of other normal pregnancy, unspecified trimester: Secondary | ICD-10-CM

## 2012-07-17 NOTE — Progress Notes (Signed)
Pt did not go for First Screen.  Will order quad screen today. [place] scheduled for anatomy.

## 2012-07-22 ENCOUNTER — Encounter: Payer: Self-pay | Admitting: Obstetrics & Gynecology

## 2012-07-22 LAB — AFP, QUAD SCREEN
Age Alone: 1:892 {titer}
Curr Gest Age: 15 wks.days
MoM for AFP: 1.86
Trisomy 18 (Edward) Syndrome Interp.: 1:11200 {titer}
uE3 Value: 0.2 ng/mL

## 2012-08-11 ENCOUNTER — Encounter: Payer: Self-pay | Admitting: Obstetrics & Gynecology

## 2012-08-11 ENCOUNTER — Ambulatory Visit (HOSPITAL_COMMUNITY)
Admission: RE | Admit: 2012-08-11 | Discharge: 2012-08-11 | Disposition: A | Discharge status: Home / Self Care | Payer: BC Managed Care – PPO | Source: Ambulatory Visit | Attending: Obstetrics & Gynecology | Admitting: Obstetrics & Gynecology

## 2012-08-11 DIAGNOSIS — O358XX0 Maternal care for other (suspected) fetal abnormality and damage, not applicable or unspecified: Secondary | ICD-10-CM | POA: Insufficient documentation

## 2012-08-11 DIAGNOSIS — Z363 Encounter for antenatal screening for malformations: Secondary | ICD-10-CM | POA: Insufficient documentation

## 2012-08-11 DIAGNOSIS — Z3490 Encounter for supervision of normal pregnancy, unspecified, unspecified trimester: Secondary | ICD-10-CM

## 2012-08-11 DIAGNOSIS — Z1389 Encounter for screening for other disorder: Secondary | ICD-10-CM | POA: Insufficient documentation

## 2012-08-14 ENCOUNTER — Encounter: Payer: BC Managed Care – PPO | Admitting: Obstetrics & Gynecology

## 2012-08-22 ENCOUNTER — Ambulatory Visit (INDEPENDENT_AMBULATORY_CARE_PROVIDER_SITE_OTHER): Payer: BC Managed Care – PPO | Admitting: Family

## 2012-08-22 DIAGNOSIS — Z3480 Encounter for supervision of other normal pregnancy, unspecified trimester: Secondary | ICD-10-CM

## 2012-08-22 NOTE — Progress Notes (Signed)
p-65  C/O stomach pain since yesterday.  Stated that she vomited this morning.  9 lb wt gain in 4 weeks

## 2012-08-22 NOTE — Progress Notes (Signed)
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

## 2012-09-18 ENCOUNTER — Encounter: Payer: BC Managed Care – PPO | Admitting: Obstetrics & Gynecology

## 2012-09-19 ENCOUNTER — Ambulatory Visit (INDEPENDENT_AMBULATORY_CARE_PROVIDER_SITE_OTHER): Payer: BC Managed Care – PPO | Admitting: Advanced Practice Midwife

## 2012-09-19 VITALS — BP 116/76 | Temp 98.0°F | Wt 161.0 lb

## 2012-09-19 DIAGNOSIS — Z3480 Encounter for supervision of other normal pregnancy, unspecified trimester: Secondary | ICD-10-CM

## 2012-09-19 DIAGNOSIS — Z3490 Encounter for supervision of normal pregnancy, unspecified, unspecified trimester: Secondary | ICD-10-CM

## 2012-09-19 NOTE — Progress Notes (Signed)
p-83 

## 2012-10-07 NOTE — Progress Notes (Signed)
28 week labs NV. Comfort measures for discomforts of pregnancy.

## 2012-10-16 ENCOUNTER — Ambulatory Visit (INDEPENDENT_AMBULATORY_CARE_PROVIDER_SITE_OTHER): Payer: BC Managed Care – PPO | Admitting: Obstetrics & Gynecology

## 2012-10-16 ENCOUNTER — Encounter: Payer: Self-pay | Admitting: Obstetrics & Gynecology

## 2012-10-16 VITALS — BP 100/60 | Temp 98.5°F | Wt 167.0 lb

## 2012-10-16 DIAGNOSIS — Z3490 Encounter for supervision of normal pregnancy, unspecified, unspecified trimester: Secondary | ICD-10-CM

## 2012-10-16 DIAGNOSIS — O34219 Maternal care for unspecified type scar from previous cesarean delivery: Secondary | ICD-10-CM

## 2012-10-16 DIAGNOSIS — Z3480 Encounter for supervision of other normal pregnancy, unspecified trimester: Secondary | ICD-10-CM

## 2012-10-16 NOTE — Progress Notes (Signed)
p-90  28 week labs today

## 2012-10-16 NOTE — Progress Notes (Signed)
Routine visit. Good FM. Glucola and labs today. Comfort measures for routine discomforts of pregnancy.

## 2012-10-17 LAB — CBC
Hemoglobin: 10.5 g/dL — ABNORMAL LOW (ref 12.0–15.0)
MCH: 30.3 pg (ref 26.0–34.0)
MCHC: 33.2 g/dL (ref 30.0–36.0)

## 2012-10-17 LAB — HIV ANTIBODY (ROUTINE TESTING W REFLEX): HIV: NONREACTIVE

## 2012-11-06 ENCOUNTER — Ambulatory Visit (INDEPENDENT_AMBULATORY_CARE_PROVIDER_SITE_OTHER): Payer: BC Managed Care – PPO | Admitting: Obstetrics & Gynecology

## 2012-11-06 ENCOUNTER — Encounter: Payer: Self-pay | Admitting: Obstetrics & Gynecology

## 2012-11-06 VITALS — BP 112/68 | Temp 97.0°F | Wt 172.0 lb

## 2012-11-06 DIAGNOSIS — O34219 Maternal care for unspecified type scar from previous cesarean delivery: Secondary | ICD-10-CM

## 2012-11-06 DIAGNOSIS — Z3490 Encounter for supervision of normal pregnancy, unspecified, unspecified trimester: Secondary | ICD-10-CM

## 2012-11-06 DIAGNOSIS — Z3480 Encounter for supervision of other normal pregnancy, unspecified trimester: Secondary | ICD-10-CM

## 2012-11-06 NOTE — Patient Instructions (Signed)
Sciatica Sciatica is pain, weakness, numbness, or tingling along the path of the sciatic nerve. The nerve starts in the lower back and runs down the back of each leg. The nerve controls the muscles in the lower leg and in the back of the knee, while also providing sensation to the back of the thigh, lower leg, and the sole of your foot. Sciatica is a symptom of another medical condition. For instance, nerve damage or certain conditions, such as a herniated disk or bone spur on the spine, pinch or put pressure on the sciatic nerve. This causes the pain, weakness, or other sensations normally associated with sciatica. Generally, sciatica only affects one side of the body. CAUSES   Herniated or slipped disc.  Degenerative disk disease.  A pain disorder involving the narrow muscle in the buttocks (piriformis syndrome).  Pelvic injury or fracture.  Pregnancy.  Tumor (rare). SYMPTOMS  Symptoms can vary from mild to very severe. The symptoms usually travel from the low back to the buttocks and down the back of the leg. Symptoms can include:  Mild tingling or dull aches in the lower back, leg, or hip.  Numbness in the back of the calf or sole of the foot.  Burning sensations in the lower back, leg, or hip.  Sharp pains in the lower back, leg, or hip.  Leg weakness.  Severe back pain inhibiting movement. These symptoms may get worse with coughing, sneezing, laughing, or prolonged sitting or standing. Also, being overweight may worsen symptoms. DIAGNOSIS  Your caregiver will perform a physical exam to look for common symptoms of sciatica. He or she may ask you to do certain movements or activities that would trigger sciatic nerve pain. Other tests may be performed to find the cause of the sciatica. These may include:  Blood tests.  X-rays.  Imaging tests, such as an MRI or CT scan. TREATMENT  Treatment is directed at the cause of the sciatic pain. Sometimes, treatment is not necessary  and the pain and discomfort goes away on its own. If treatment is needed, your caregiver may suggest:  Over-the-counter medicines to relieve pain.  Prescription medicines, such as anti-inflammatory medicine, muscle relaxants, or narcotics.  Applying heat or ice to the painful area.  Steroid injections to lessen pain, irritation, and inflammation around the nerve.  Reducing activity during periods of pain.  Exercising and stretching to strengthen your abdomen and improve flexibility of your spine. Your caregiver may suggest losing weight if the extra weight makes the back pain worse.  Physical therapy.  Surgery to eliminate what is pressing or pinching the nerve, such as a bone spur or part of a herniated disk. HOME CARE INSTRUCTIONS   Only take over-the-counter or prescription medicines for pain or discomfort as directed by your caregiver.  Apply ice to the affected area for 20 minutes, 3 4 times a day for the first 48 72 hours. Then try heat in the same way.  Exercise, stretch, or perform your usual activities if these do not aggravate your pain.  Attend physical therapy sessions as directed by your caregiver.  Keep all follow-up appointments as directed by your caregiver.  Do not wear high heels or shoes that do not provide proper support.  Check your mattress to see if it is too soft. A firm mattress may lessen your pain and discomfort. SEEK IMMEDIATE MEDICAL CARE IF:   You lose control of your bowel or bladder (incontinence).  You have increasing weakness in the lower back,   pelvis, buttocks, or legs.  You have redness or swelling of your back.  You have a burning sensation when you urinate.  You have pain that gets worse when you lie down or awakens you at night.  Your pain is worse than you have experienced in the past.  Your pain is lasting longer than 4 weeks.  You are suddenly losing weight without reason. MAKE SURE YOU:  Understand these  instructions.  Will watch your condition.  Will get help right away if you are not doing well or get worse. Document Released: 10/09/2001 Document Revised: 04/15/2012 Document Reviewed: 02/24/2012 [employer] Patient Information 2013 [employer].  

## 2012-11-06 NOTE — Progress Notes (Signed)
Routine visit. She complains of left sciatica pain. I have shown her exercises to help and rec'd a chiro prn. I have told her to stop taking IBU. She reports good FM, denies VB, ROM, or CTXs.

## 2012-11-06 NOTE — Progress Notes (Signed)
p-94 

## 2012-11-13 ENCOUNTER — Encounter: Payer: Self-pay | Admitting: *Deleted

## 2012-11-14 ENCOUNTER — Encounter: Payer: Self-pay | Admitting: *Deleted

## 2012-11-14 ENCOUNTER — Ambulatory Visit (INDEPENDENT_AMBULATORY_CARE_PROVIDER_SITE_OTHER): Payer: BC Managed Care – PPO | Admitting: Advanced Practice Midwife

## 2012-11-14 VITALS — BP 114/72 | Temp 97.2°F | Wt 171.0 lb

## 2012-11-14 DIAGNOSIS — Z3480 Encounter for supervision of other normal pregnancy, unspecified trimester: Secondary | ICD-10-CM

## 2012-11-14 DIAGNOSIS — N390 Urinary tract infection, site not specified: Secondary | ICD-10-CM

## 2012-11-14 DIAGNOSIS — R42 Dizziness and giddiness: Secondary | ICD-10-CM

## 2012-11-14 LAB — POCT URINALYSIS DIPSTICK
Ketones, UA: NEGATIVE
Spec Grav, UA: 1.01
pH, UA: 7

## 2012-11-14 LAB — CBC
HCT: 30.9 % — ABNORMAL LOW (ref 36.0–46.0)
Hemoglobin: 10.5 g/dL — ABNORMAL LOW (ref 12.0–15.0)
MCH: 29.7 pg (ref 26.0–34.0)
MCHC: 34 g/dL (ref 30.0–36.0)
MCV: 87.5 fL (ref 78.0–100.0)

## 2012-11-14 NOTE — Progress Notes (Signed)
P-89 - Patient is not feeling well today - c/o fatigue, n/v, dizziness

## 2012-11-14 NOTE — Patient Instructions (Addendum)
Viral Gastroenteritis  Viral gastroenteritis is also known as stomach flu. This condition affects the stomach and intestinal tract. It can cause sudden diarrhea and vomiting. The illness typically lasts 3 to 8 days. Most people develop an immune response that eventually gets rid of the virus. While this natural response develops, the virus can make you quite ill.  CAUSES   Many different viruses can cause gastroenteritis, such as rotavirus or noroviruses. You can catch one of these viruses by consuming contaminated food or water. You may also catch a virus by sharing utensils or other personal items with an infected person or by touching a contaminated surface.  SYMPTOMS   The most common symptoms are diarrhea and vomiting. These problems can cause a severe loss of body fluids (dehydration) and a body salt (electrolyte) imbalance. Other symptoms may include:   Fever.   Headache.   Fatigue.   Abdominal pain.  DIAGNOSIS   Your caregiver can usually diagnose viral gastroenteritis based on your symptoms and a physical exam. A stool sample may also be taken to test for the presence of viruses or other infections.  TREATMENT   This illness typically goes away on its own. Treatments are aimed at rehydration. The most serious cases of viral gastroenteritis involve vomiting so severely that you are not able to keep fluids down. In these cases, fluids must be given through an intravenous line (IV).  HOME CARE INSTRUCTIONS    Drink enough fluids to keep your urine clear or pale yellow. Drink small amounts of fluids frequently and increase the amounts as tolerated.   Ask your caregiver for specific rehydration instructions.   Avoid:   Foods high in sugar.   Alcohol.   Carbonated drinks.   Tobacco.   Juice.   Caffeine drinks.   Extremely hot or cold fluids.   Fatty, greasy foods.   Too much intake of anything at one time.   Dairy products until 24 to 48 hours after diarrhea stops.    You may consume probiotics. Probiotics are active cultures of beneficial bacteria. They may lessen the amount and number of diarrheal stools in adults. Probiotics can be found in yogurt with active cultures and in supplements.   Wash your hands well to avoid spreading the virus.   Only take over-the-counter or prescription medicines for pain, discomfort, or fever as directed by your caregiver. Do not give aspirin to children. Antidiarrheal medicines are not recommended.   Ask your caregiver if you should continue to take your regular prescribed and over-the-counter medicines.   Keep all follow-up appointments as directed by your caregiver.  SEEK IMMEDIATE MEDICAL CARE IF:    You are unable to keep fluids down.   You do not urinate at least once every 6 to 8 hours.   You develop shortness of breath.   You notice blood in your stool or vomit. This may look like coffee grounds.   You have abdominal pain that increases or is concentrated in one small area (localized).   You have persistent vomiting or diarrhea.   You have a fever.   The patient is a child younger than 3 months, and he or she has a fever.   The patient is a child older than 3 months, and he or she has a fever and persistent symptoms.   The patient is a child older than 3 months, and he or she has a fever and symptoms suddenly get worse.   The patient is a baby, and he   or she has no tears when crying.  MAKE SURE YOU:    Understand these instructions.   Will watch your condition.   Will get help right away if you are not doing well or get worse.  Document Released: 10/15/2005 Document Revised: 01/07/2012 Document Reviewed: 08/01/2011  ExitCare Patient Information 2013 [employer].      B.R.A.T. Diet  Your doctor has recommended the B.R.A.T. diet for you or your child until the condition improves. This is often used to help control diarrhea and vomiting symptoms. If you or your child can tolerate clear liquids, you may have:    Bananas.   Rice.   Applesauce.   Toast (and other simple starches such as crackers, potatoes, noodles).  Be sure to avoid dairy products, meats, and fatty foods until symptoms are better. Fruit juices such as apple, grape, and prune juice can make diarrhea worse. Avoid these. Continue this diet for 2 days or as instructed by your caregiver.  Document Released: 10/15/2005 Document Revised: 10/04/2011 Document Reviewed: 04/03/2007  ExitCare Patient Information 2012 [employer].

## 2012-11-14 NOTE — Progress Notes (Signed)
N/V, dizziness yesterday. Controlled w/ Zofran. Still feeling fatigued today.No fever,chills, URI Sx.BRAT diet. Increase fluids and rest CBG 94.

## 2012-11-18 ENCOUNTER — Encounter: Payer: Self-pay | Admitting: *Deleted

## 2012-11-18 ENCOUNTER — Ambulatory Visit (INDEPENDENT_AMBULATORY_CARE_PROVIDER_SITE_OTHER): Payer: BC Managed Care – PPO | Admitting: Obstetrics & Gynecology

## 2012-11-18 VITALS — BP 130/83 | Wt 173.0 lb

## 2012-11-18 DIAGNOSIS — Z3480 Encounter for supervision of other normal pregnancy, unspecified trimester: Secondary | ICD-10-CM

## 2012-11-18 DIAGNOSIS — O34219 Maternal care for unspecified type scar from previous cesarean delivery: Secondary | ICD-10-CM

## 2012-11-18 NOTE — Progress Notes (Signed)
GCT = 110.  No complaints.  Hands still hurt with braces on.  Discussed VBAC vs Rpt C/S.  Pt opts for VBAC.  Will sign consent.  Pt still having pain with hands and having to wear braces 24/7.  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  Will fill out FMLA papers for work.  Pt had one dizzy episode last Thursday.  She vomited x1 and then felt tired the rest of the day.  She also felt sick the next day.  She is asymptomatic currently.  If dizziness continues will do further evaluation.

## 2012-11-18 NOTE — Patient Instructions (Signed)
  Place 32-42 weeks prenatal visit patient instructions here.  

## 2012-11-18 NOTE — Progress Notes (Signed)
p-98 

## 2012-12-02 ENCOUNTER — Ambulatory Visit (INDEPENDENT_AMBULATORY_CARE_PROVIDER_SITE_OTHER): Payer: BC Managed Care – PPO | Admitting: Obstetrics & Gynecology

## 2012-12-02 VITALS — BP 115/72 | Wt 173.0 lb

## 2012-12-02 DIAGNOSIS — Z3480 Encounter for supervision of other normal pregnancy, unspecified trimester: Secondary | ICD-10-CM

## 2012-12-02 DIAGNOSIS — Z3490 Encounter for supervision of normal pregnancy, unspecified, unspecified trimester: Secondary | ICD-10-CM

## 2012-12-02 NOTE — Progress Notes (Signed)
Still having finger numbness.  Braces not working.  Does not want to see orthopedist unless it continues post partum.  Still considering VBAC vs c/s.

## 2012-12-02 NOTE — Progress Notes (Signed)
P - 92 - Pt states she has been sleeping a lot "really tired"

## 2012-12-17 ENCOUNTER — Other Ambulatory Visit: Payer: Self-pay | Admitting: Obstetrics & Gynecology

## 2012-12-17 ENCOUNTER — Ambulatory Visit (INDEPENDENT_AMBULATORY_CARE_PROVIDER_SITE_OTHER): Payer: BC Managed Care – PPO | Admitting: Obstetrics & Gynecology

## 2012-12-17 VITALS — BP 120/68 | Wt 173.0 lb

## 2012-12-17 DIAGNOSIS — Z113 Encounter for screening for infections with a predominantly sexual mode of transmission: Secondary | ICD-10-CM

## 2012-12-17 DIAGNOSIS — Z3490 Encounter for supervision of normal pregnancy, unspecified, unspecified trimester: Secondary | ICD-10-CM

## 2012-12-17 DIAGNOSIS — O34219 Maternal care for unspecified type scar from previous cesarean delivery: Secondary | ICD-10-CM

## 2012-12-17 DIAGNOSIS — N760 Acute vaginitis: Secondary | ICD-10-CM

## 2012-12-17 DIAGNOSIS — Z3480 Encounter for supervision of other normal pregnancy, unspecified trimester: Secondary | ICD-10-CM

## 2012-12-17 NOTE — Patient Instructions (Signed)
Return to clinic for any obstetric concerns or go to [facility] for evaluation  

## 2012-12-17 NOTE — Progress Notes (Signed)
Pelvic cultures done.  Thinking about possible RCS, wants to discuss with Dr. Penne Lash next week.  No other complaints or concerns.  Fetal movement and labor precautions reviewed.

## 2012-12-17 NOTE — Progress Notes (Signed)
P = 87 

## 2012-12-22 ENCOUNTER — Encounter: Payer: Self-pay | Admitting: Obstetrics & Gynecology

## 2012-12-25 ENCOUNTER — Encounter: Payer: Self-pay | Admitting: Obstetrics & Gynecology

## 2012-12-25 ENCOUNTER — Ambulatory Visit (INDEPENDENT_AMBULATORY_CARE_PROVIDER_SITE_OTHER): Payer: BC Managed Care – PPO | Admitting: Obstetrics & Gynecology

## 2012-12-25 VITALS — BP 118/65 | Wt 176.0 lb

## 2012-12-25 DIAGNOSIS — O34219 Maternal care for unspecified type scar from previous cesarean delivery: Secondary | ICD-10-CM

## 2012-12-25 DIAGNOSIS — Z3480 Encounter for supervision of other normal pregnancy, unspecified trimester: Secondary | ICD-10-CM

## 2012-12-25 DIAGNOSIS — Z3493 Encounter for supervision of normal pregnancy, unspecified, third trimester: Secondary | ICD-10-CM

## 2012-12-25 NOTE — Progress Notes (Signed)
P=88 

## 2012-12-25 NOTE — Progress Notes (Signed)
Routine visit. Good FM. No OB problems. We discussed TOLAC versus RLTCS. I quoted her a 67% VBAC rate. She opts to wait and decide next week. Labor precautions. Reasonable pelvis for vaginal delivery. Negative cvx cultures.

## 2012-12-31 ENCOUNTER — Ambulatory Visit (INDEPENDENT_AMBULATORY_CARE_PROVIDER_SITE_OTHER): Payer: BC Managed Care – PPO | Admitting: Obstetrics & Gynecology

## 2012-12-31 ENCOUNTER — Encounter (HOSPITAL_COMMUNITY): Payer: Self-pay | Admitting: Pharmacist

## 2012-12-31 ENCOUNTER — Telehealth: Payer: Self-pay

## 2012-12-31 ENCOUNTER — Encounter (HOSPITAL_COMMUNITY): Payer: Self-pay

## 2012-12-31 VITALS — BP 123/80 | Wt 175.0 lb

## 2012-12-31 DIAGNOSIS — Z3493 Encounter for supervision of normal pregnancy, unspecified, third trimester: Secondary | ICD-10-CM

## 2012-12-31 DIAGNOSIS — Z3480 Encounter for supervision of other normal pregnancy, unspecified trimester: Secondary | ICD-10-CM

## 2012-12-31 NOTE — Progress Notes (Signed)
p-85  Wants cervix checked and to discuss VBAC vs C/Section

## 2012-12-31 NOTE — Telephone Encounter (Signed)
Called [facility] and spoke with Harriett Sine R.  no prior authorization is required for repeat c-section unless its after 96 hours.

## 2012-12-31 NOTE — Progress Notes (Signed)
Pt wants elective c/s.  Pt booked for this Friday at 9:15 a.m.  Spoke with Janey Greaser at [facility].

## 2013-01-01 ENCOUNTER — Encounter (HOSPITAL_COMMUNITY): Payer: Self-pay

## 2013-01-01 ENCOUNTER — Encounter (HOSPITAL_COMMUNITY)
Admission: RE | Admit: 2013-01-01 | Discharge: 2013-01-01 | Disposition: A | Discharge status: Home / Self Care | Payer: BC Managed Care – PPO | Source: Ambulatory Visit | Attending: Obstetrics & Gynecology | Admitting: Obstetrics & Gynecology

## 2013-01-01 VITALS — BP 130/80 | Ht 62.0 in | Wt 173.0 lb

## 2013-01-01 DIAGNOSIS — O34219 Maternal care for unspecified type scar from previous cesarean delivery: Secondary | ICD-10-CM

## 2013-01-01 HISTORY — DX: Other specified health status: Z78.9

## 2013-01-01 LAB — CBC
HCT: 31.6 % — ABNORMAL LOW (ref 36.0–46.0)
Hemoglobin: 10.6 g/dL — ABNORMAL LOW (ref 12.0–15.0)
RBC: 3.63 MIL/uL — ABNORMAL LOW (ref 3.87–5.11)
WBC: 5.3 10*3/uL (ref 4.0–10.5)

## 2013-01-01 LAB — TYPE AND SCREEN
ABO/RH(D): AB POS
Antibody Screen: NEGATIVE

## 2013-01-01 NOTE — Patient Instructions (Signed)
Your procedure is scheduled on:01/02/13  Enter through the Main Entrance at : 8am Pick up desk phone and dial [phone] and inform us of your arrival.  Please call [phone] if you have any problems the morning of surgery.  Remember: Do not eat or drink after midnight: tonight   Take these meds the morning of surgery with a sip of water:none  DO NOT wear jewelry, eye make-up, lipstick,body lotion, or dark fingernail polish. Do not shave for 48 hours prior to surgery.  If you are to be admitted after surgery, leave suitcase in car until your room has been assigned. Patients discharged on the day of surgery will not be allowed to drive home.

## 2013-01-02 ENCOUNTER — Encounter (HOSPITAL_COMMUNITY): Payer: Self-pay | Admitting: Anesthesiology

## 2013-01-02 ENCOUNTER — Inpatient Hospital Stay (HOSPITAL_COMMUNITY)
Admission: AD | Admit: 2013-01-02 | Discharge: 2013-01-05 | DRG: 371 | Disposition: A | Discharge status: Home / Self Care | Payer: BC Managed Care – PPO | Source: Ambulatory Visit | Attending: Obstetrics & Gynecology | Admitting: Obstetrics & Gynecology

## 2013-01-02 ENCOUNTER — Encounter (HOSPITAL_COMMUNITY)
Admission: AD | Disposition: A | Discharge status: Home / Self Care | Payer: Self-pay | Source: Ambulatory Visit | Attending: Obstetrics & Gynecology

## 2013-01-02 ENCOUNTER — Inpatient Hospital Stay (HOSPITAL_COMMUNITY): Payer: BC Managed Care – PPO | Admitting: Anesthesiology

## 2013-01-02 DIAGNOSIS — O34219 Maternal care for unspecified type scar from previous cesarean delivery: Secondary | ICD-10-CM

## 2013-01-02 DIAGNOSIS — O34599 Maternal care for other abnormalities of gravid uterus, unspecified trimester: Secondary | ICD-10-CM | POA: Diagnosis present

## 2013-01-02 DIAGNOSIS — N838 Other noninflammatory disorders of ovary, fallopian tube and broad ligament: Secondary | ICD-10-CM

## 2013-01-02 LAB — TYPE AND SCREEN
ABO/RH(D): AB POS
Antibody Screen: NEGATIVE

## 2013-01-02 SURGERY — Surgical Case
Anesthesia: Spinal | Site: Abdomen | Wound class: Clean Contaminated

## 2013-01-02 MED ORDER — NALOXONE HCL 0.4 MG/ML IJ SOLN: 0.4000 mg | INTRAMUSCULAR | Status: DC | PRN

## 2013-01-02 MED ORDER — ONDANSETRON HCL 4 MG PO TABS: 4.0000 mg | ORAL_TABLET | ORAL | Status: DC | PRN

## 2013-01-02 MED ORDER — PHENYLEPHRINE 40 MCG/ML (10ML) SYRINGE FOR IV PUSH (FOR BLOOD PRESSURE SUPPORT)
PREFILLED_SYRINGE | INTRAVENOUS | Status: AC
  Filled 2013-01-02: qty 5

## 2013-01-02 MED ORDER — SCOPOLAMINE 1 MG/3DAYS TD PT72
MEDICATED_PATCH | TRANSDERMAL | Status: AC
  Administered 2013-01-02: 1.5 mg via TRANSDERMAL
  Filled 2013-01-02: qty 1

## 2013-01-02 MED ORDER — IBUPROFEN 600 MG PO TABS
600.0000 mg | ORAL_TABLET | Freq: Four times a day (QID) | ORAL | Status: DC
  Administered 2013-01-03 – 2013-01-05 (×8): 600 mg via ORAL
  Filled 2013-01-02 (×6): qty 1

## 2013-01-02 MED ORDER — SIMETHICONE 80 MG PO CHEW
80.0000 mg | CHEWABLE_TABLET | ORAL | Status: DC | PRN
  Administered 2013-01-03 – 2013-01-04 (×3): 80 mg via ORAL

## 2013-01-02 MED ORDER — MORPHINE SULFATE (PF) 0.5 MG/ML IJ SOLN
INTRAMUSCULAR | Status: DC | PRN
  Administered 2013-01-02: .15 mg via INTRATHECAL

## 2013-01-02 MED ORDER — KETOROLAC TROMETHAMINE 30 MG/ML IJ SOLN: 30.0000 mg | Freq: Four times a day (QID) | INTRAMUSCULAR | Status: AC | PRN

## 2013-01-02 MED ORDER — LACTATED RINGERS IV SOLN
INTRAVENOUS | Status: DC
  Administered 2013-01-02: 125 mL/h via INTRAVENOUS
  Administered 2013-01-02: 20:00:00 via INTRAVENOUS

## 2013-01-02 MED ORDER — PHENYLEPHRINE HCL 10 MG/ML IJ SOLN
INTRAMUSCULAR | Status: DC | PRN
  Administered 2013-01-02: 80 ug via INTRAVENOUS
  Administered 2013-01-02: 40 ug via INTRAVENOUS
  Administered 2013-01-02 (×3): 80 ug via INTRAVENOUS
  Administered 2013-01-02 (×2): 40 ug via INTRAVENOUS
  Administered 2013-01-02: 80 ug via INTRAVENOUS
  Administered 2013-01-02: 40 ug via INTRAVENOUS
  Administered 2013-01-02 (×2): 80 ug via INTRAVENOUS

## 2013-01-02 MED ORDER — METHYLERGONOVINE MALEATE 0.2 MG/ML IJ SOLN
INTRAMUSCULAR | Status: DC | PRN
  Administered 2013-01-02: 0.2 mg via INTRAMUSCULAR

## 2013-01-02 MED ORDER — METOCLOPRAMIDE HCL 5 MG/ML IJ SOLN
10.0000 mg | Freq: Three times a day (TID) | INTRAMUSCULAR | Status: DC | PRN
  Administered 2013-01-02: 10 mg via INTRAVENOUS
  Filled 2013-01-02: qty 2

## 2013-01-02 MED ORDER — FENTANYL CITRATE 0.05 MG/ML IJ SOLN
INTRAMUSCULAR | Status: DC | PRN
  Administered 2013-01-02: 25 ug via INTRATHECAL

## 2013-01-02 MED ORDER — LACTATED RINGERS IV SOLN
INTRAVENOUS | Status: DC | PRN
  Administered 2013-01-02: 10:00:00 via INTRAVENOUS

## 2013-01-02 MED ORDER — DIPHENHYDRAMINE HCL 25 MG PO CAPS: 25.0000 mg | ORAL_CAPSULE | ORAL | Status: DC | PRN

## 2013-01-02 MED ORDER — MORPHINE SULFATE 0.5 MG/ML IJ SOLN
INTRAMUSCULAR | Status: AC
  Filled 2013-01-02: qty 10

## 2013-01-02 MED ORDER — DIPHENHYDRAMINE HCL 25 MG PO CAPS: 25.0000 mg | ORAL_CAPSULE | Freq: Four times a day (QID) | ORAL | Status: DC | PRN

## 2013-01-02 MED ORDER — METHYLERGONOVINE MALEATE 0.2 MG/ML IJ SOLN
INTRAMUSCULAR | Status: AC
  Filled 2013-01-02: qty 1

## 2013-01-02 MED ORDER — KETOROLAC TROMETHAMINE 30 MG/ML IJ SOLN
INTRAMUSCULAR | Status: AC
  Administered 2013-01-02: 30 mg via INTRAVENOUS
  Filled 2013-01-02: qty 1

## 2013-01-02 MED ORDER — MEASLES, MUMPS & RUBELLA VAC ~~LOC~~ INJ: 0.5000 mL | INJECTION | Freq: Once | SUBCUTANEOUS | Status: DC

## 2013-01-02 MED ORDER — DIBUCAINE 1 % RE OINT: 1.0000 "application " | TOPICAL_OINTMENT | RECTAL | Status: DC | PRN

## 2013-01-02 MED ORDER — LACTATED RINGERS IV SOLN
INTRAVENOUS | Status: DC
  Administered 2013-01-02 (×2): via INTRAVENOUS

## 2013-01-02 MED ORDER — OXYTOCIN 10 UNIT/ML IJ SOLN
INTRAMUSCULAR | Status: AC
  Filled 2013-01-02: qty 4

## 2013-01-02 MED ORDER — CEFAZOLIN SODIUM-DEXTROSE 2-3 GM-% IV SOLR
INTRAVENOUS | Status: AC
  Filled 2013-01-02: qty 50

## 2013-01-02 MED ORDER — MENTHOL 3 MG MT LOZG: 1.0000 | LOZENGE | OROMUCOSAL | Status: DC | PRN

## 2013-01-02 MED ORDER — 0.9 % SODIUM CHLORIDE (POUR BTL) OPTIME
TOPICAL | Status: DC | PRN
  Administered 2013-01-02: 1000 mL

## 2013-01-02 MED ORDER — DIPHENHYDRAMINE HCL 50 MG/ML IJ SOLN: 25.0000 mg | INTRAMUSCULAR | Status: DC | PRN

## 2013-01-02 MED ORDER — METOCLOPRAMIDE HCL 5 MG/ML IJ SOLN: 10.0000 mg | Freq: Once | INTRAMUSCULAR | Status: DC | PRN

## 2013-01-02 MED ORDER — WITCH HAZEL-GLYCERIN EX PADS: 1.0000 "application " | MEDICATED_PAD | CUTANEOUS | Status: DC | PRN

## 2013-01-02 MED ORDER — LANOLIN HYDROUS EX OINT: 1.0000 "application " | TOPICAL_OINTMENT | CUTANEOUS | Status: DC | PRN

## 2013-01-02 MED ORDER — OXYTOCIN 10 UNIT/ML IJ SOLN
40.0000 [IU] | INTRAVENOUS | Status: DC | PRN
  Administered 2013-01-02: 40 [IU] via INTRAVENOUS

## 2013-01-02 MED ORDER — ONDANSETRON HCL 4 MG/2ML IJ SOLN
INTRAMUSCULAR | Status: AC
  Filled 2013-01-02: qty 2

## 2013-01-02 MED ORDER — ONDANSETRON HCL 4 MG/2ML IJ SOLN: 4.0000 mg | INTRAMUSCULAR | Status: DC | PRN

## 2013-01-02 MED ORDER — ONDANSETRON HCL 4 MG/2ML IJ SOLN
INTRAMUSCULAR | Status: DC | PRN
  Administered 2013-01-02: 4 mg via INTRAVENOUS

## 2013-01-02 MED ORDER — MEPERIDINE HCL 25 MG/ML IJ SOLN: 6.2500 mg | INTRAMUSCULAR | Status: DC | PRN

## 2013-01-02 MED ORDER — OXYCODONE-ACETAMINOPHEN 5-325 MG PO TABS
1.0000 | ORAL_TABLET | ORAL | Status: DC | PRN
  Administered 2013-01-03 – 2013-01-05 (×6): 1 via ORAL
  Filled 2013-01-02 (×6): qty 1

## 2013-01-02 MED ORDER — NALBUPHINE HCL 10 MG/ML IJ SOLN
5.0000 mg | INTRAMUSCULAR | Status: DC | PRN
  Filled 2013-01-02: qty 1

## 2013-01-02 MED ORDER — EPHEDRINE SULFATE 50 MG/ML IJ SOLN
INTRAMUSCULAR | Status: DC | PRN
  Administered 2013-01-02 (×3): 5 mg via INTRAVENOUS
  Administered 2013-01-02: 10 mg via INTRAVENOUS

## 2013-01-02 MED ORDER — ONDANSETRON HCL 4 MG/2ML IJ SOLN: 4.0000 mg | Freq: Three times a day (TID) | INTRAMUSCULAR | Status: DC | PRN

## 2013-01-02 MED ORDER — TETANUS-DIPHTH-ACELL PERTUSSIS 5-2.5-18.5 LF-MCG/0.5 IM SUSP: 0.5000 mL | Freq: Once | INTRAMUSCULAR | Status: DC

## 2013-01-02 MED ORDER — SCOPOLAMINE 1 MG/3DAYS TD PT72
1.0000 | MEDICATED_PATCH | Freq: Once | TRANSDERMAL | Status: DC
  Administered 2013-01-02: 1.5 mg via TRANSDERMAL

## 2013-01-02 MED ORDER — IBUPROFEN 600 MG PO TABS
600.0000 mg | ORAL_TABLET | Freq: Four times a day (QID) | ORAL | Status: DC | PRN
  Filled 2013-01-02 (×6): qty 1

## 2013-01-02 MED ORDER — NALOXONE HCL 1 MG/ML IJ SOLN
1.0000 ug/kg/h | INTRAVENOUS | Status: DC | PRN
  Filled 2013-01-02: qty 2

## 2013-01-02 MED ORDER — EPHEDRINE 5 MG/ML INJ
INTRAVENOUS | Status: AC
  Filled 2013-01-02: qty 10

## 2013-01-02 MED ORDER — FENTANYL CITRATE 0.05 MG/ML IJ SOLN
INTRAMUSCULAR | Status: AC
  Filled 2013-01-02: qty 2

## 2013-01-02 MED ORDER — CEFAZOLIN SODIUM-DEXTROSE 2-3 GM-% IV SOLR
2.0000 g | INTRAVENOUS | Status: AC
  Administered 2013-01-02: 2 g via INTRAVENOUS

## 2013-01-02 MED ORDER — DIPHENHYDRAMINE HCL 50 MG/ML IJ SOLN
12.5000 mg | INTRAMUSCULAR | Status: DC | PRN
  Administered 2013-01-03: 12.5 mg via INTRAVENOUS
  Filled 2013-01-02: qty 1

## 2013-01-02 MED ORDER — FENTANYL CITRATE 0.05 MG/ML IJ SOLN: 25.0000 ug | INTRAMUSCULAR | Status: DC | PRN

## 2013-01-02 MED ORDER — SODIUM CHLORIDE 0.9 % IJ SOLN: 3.0000 mL | INTRAMUSCULAR | Status: DC | PRN

## 2013-01-02 MED ORDER — BUPIVACAINE IN DEXTROSE 0.75-8.25 % IT SOLN
INTRATHECAL | Status: DC | PRN
  Administered 2013-01-02: 1.4 mL via INTRATHECAL

## 2013-01-02 MED ORDER — OXYTOCIN 40 UNITS IN LACTATED RINGERS INFUSION - SIMPLE MED: 62.5000 mL/h | INTRAVENOUS | Status: AC

## 2013-01-02 SURGICAL SUPPLY — 31 items
CLOTH BEACON ORANGE TIMEOUT ST (SAFETY) ×2 IMPLANT
CONTAINER PREFILL 10% NBF 15ML (MISCELLANEOUS) IMPLANT
DRAIN JACKSON PRT FLT 7MM (DRAIN) IMPLANT
DRAPE LG THREE QUARTER DISP (DRAPES) ×2 IMPLANT
DRSG OPSITE POSTOP 4X10 (GAUZE/BANDAGES/DRESSINGS) ×2 IMPLANT
DURAPREP 26ML APPLICATOR (WOUND CARE) ×2 IMPLANT
ELECT REM PT RETURN 9FT ADLT (ELECTROSURGICAL) ×2
ELECTRODE REM PT RTRN 9FT ADLT (ELECTROSURGICAL) ×1 IMPLANT
EVACUATOR SILICONE 100CC (DRAIN) IMPLANT
EXTRACTOR VACUUM M CUP 4 TUBE (SUCTIONS) IMPLANT
GLOVE BIO SURGEON STRL SZ7 (GLOVE) ×2 IMPLANT
GLOVE BIOGEL PI IND STRL 7.0 (GLOVE) ×1 IMPLANT
GLOVE BIOGEL PI INDICATOR 7.0 (GLOVE) ×1
GOWN STRL REIN XL XLG (GOWN DISPOSABLE) ×4 IMPLANT
KIT ABG SYR 3ML LUER SLIP (SYRINGE) IMPLANT
NEEDLE HYPO 25X5/8 SAFETYGLIDE (NEEDLE) ×2 IMPLANT
NS IRRIG 1000ML POUR BTL (IV SOLUTION) ×2 IMPLANT
PACK C SECTION WH (CUSTOM PROCEDURE TRAY) ×2 IMPLANT
PAD ABD 7.5X8 STRL (GAUZE/BANDAGES/DRESSINGS) ×2 IMPLANT
PAD OB MATERNITY 4.3X12.25 (PERSONAL CARE ITEMS) ×2 IMPLANT
RTRCTR C-SECT PINK 25CM LRG (MISCELLANEOUS) ×2 IMPLANT
SLEEVE SCD COMPRESS KNEE MED (MISCELLANEOUS) IMPLANT
STAPLER VISISTAT 35W (STAPLE) IMPLANT
SUT CHROMIC 0 CT 1 (SUTURE) ×2 IMPLANT
SUT VIC AB 0 CTX 36 (SUTURE) ×5
SUT VIC AB 0 CTX36XBRD ANBCTRL (SUTURE) ×5 IMPLANT
SUT VIC AB 4-0 KS 27 (SUTURE) IMPLANT
TAPE CLOTH SURG 4X10 WHT LF (GAUZE/BANDAGES/DRESSINGS) ×2 IMPLANT
TOWEL OR 17X24 6PK STRL BLUE (TOWEL DISPOSABLE) ×6 IMPLANT
TRAY FOLEY CATH 14FR (SET/KITS/TRAYS/PACK) ×2 IMPLANT
WATER STERILE IRR 1000ML POUR (IV SOLUTION) ×2 IMPLANT

## 2013-01-02 NOTE — Transfer of Care (Signed)
Immediate Anesthesia Transfer of Care Note  Patient: Katie Love  Procedure(s) Performed: Procedure(s): CESAREAN SECTION (N/A)  Patient Location: PACU  Anesthesia Type:Spinal  Level of Consciousness: awake, alert , oriented and patient cooperative  Airway & Oxygen Therapy: Patient Spontanous Breathing  Post-op Assessment: Report given to PACU RN and Post -op Vital signs reviewed and stable  Post vital signs: stable  Complications: No apparent anesthesia complications

## 2013-01-02 NOTE — H&P (Signed)
Katie Love is a 27 y.o. female presenting for elective c/s at term. History OB History   Grav Para Term Preterm Abortions TAB SAB Ect Mult Living   2 1 1       1      Past Medical History  Diagnosis Date  . No pertinent past medical history   . Medical history non-contributory    Past Surgical History  Procedure Laterality Date  . Cesarean section  2008   Family History: noncontributory; first son has speech delay Social History:  reports that she has never smoked. She has never used smokeless tobacco. She reports that she does not drink alcohol or use illicit drugs.   Prenatal Transfer Tool  Maternal Diabetes: No Genetic Screening: Normal Maternal Ultrasounds/Referrals: Normal Fetal Ultrasounds or other Referrals:  None Maternal Substance Abuse:  No Significant Maternal Medications:  None Significant Maternal Lab Results:  None Other Comments:  Pt is a Char[employer] fundraiser  Review of Systems  Constitutional: Negative.   Respiratory: Negative.   Cardiovascular: Negative.   Gastrointestinal: Negative.   Genitourinary: Negative.   Musculoskeletal: Negative.       Blood pressure 117/70, pulse 83, temperature 98.2 F (36.8 C), temperature source Oral, resp. rate 18, SpO2 99.00%. Exam Physical Exam  Vitals reviewed. Constitutional: She is oriented to person, place, and time. She appears well-developed and well-nourished. No distress.  HENT:  Head: Normocephalic and atraumatic.  Eyes: Conjunctivae are normal.  Neck: Neck supple. No thyromegaly present.  Cardiovascular: Normal rate and regular rhythm.   Respiratory: Effort normal. No respiratory distress.  GI: Soft. She exhibits no distension. There is no tenderness. There is no rebound and no guarding.  Neurological: She is alert and oriented to person, place, and time.  Skin: Skin is warm and dry.  Psychiatric: She has a normal mood and affect.    Prenatal labs: ABO, Rh: --/--/AB POS (03/06 1024) Antibody: NEG (03/06  1024) Rubella:  pending RPR: NON REACTIVE (03/06 1025)  HBsAg:   pending HIV: NON REACTIVE (12/19 1626)  GBS:   negative  Assessment/Plan: 27 yo G2P1001 at 39 weeks and 1 day for rpt c/s.  The risks of cesarean section discussed with the patient included but were not limited to: bleeding which may require transfusion or reoperation; infection which may require antibiotics; injury to bowel, bladder, ureters or other surrounding organs; injury to the fetus; need for additional procedures including hysterectomy in the event of a life-threatening hemorrhage; placental abnormalities wth subsequent pregnancies, incisional problems, thromboembolic phenomenon and other postoperative/anesthesia complications. The patient concurred with the proposed plan, giving informed written consent for the procedure.     LEGGETT,KELLY H. 01/02/2013, 9:09 AM

## 2013-01-02 NOTE — Op Note (Signed)
[name] PROCEDURE DATE: 01/02/2013  PREOPERATIVE DIAGNOSIS: Intrauterine pregnancy at  [redacted]w[redacted]d weeks gestation for elective repeat cesarean sectiion  POSTOPERATIVE DIAGNOSIS: The same  PROCEDURE:    Low Transverse Cesarean Section, right paratubal cystectomy  SURGEON:  Dr. Elsie Lincoln  ASSISTANT: Dr. Gregor Hams  INDICATIONS: Katie Love is a 27 y.o. [id number] at [redacted]w[redacted]d scheduled for cesarean section for elective repeat.  The risks of cesarean section discussed with the patient included but were not limited to: bleeding which may require transfusion or reoperation; infection which may require antibiotics; injury to bowel, bladder, ureters or other surrounding organs; injury to the fetus; need for additional procedures including hysterectomy in the event of a life-threatening hemorrhage; placental abnormalities wth subsequent pregnancies, incisional problems, thromboembolic phenomenon and other postoperative/anesthesia complications. The patient concurred with the proposed plan, giving informed written consent for the procedure.    FINDINGS:  Viable  infant in cephalic presentation, Apgars 9 and 9,  clear amniotic fluid.  Intact placenta, three vessel cord.  Grossly normal uterus, ovaries and fallopian tubes.  Small paratubal cyst on right fallopian tube which was removed. .   ANESTHESIA:    Epidural ESTIMATED BLOOD LOSS: 800 ml SPECIMENS: Placenta and right paratubal cyst sent to pathology COMPLICATIONS: None immediate  PROCEDURE IN DETAIL:  The patient received intravenous antibiotics and had sequential compression devices applied to her lower extremities while in the preoperative area.  She was then taken to the operating room where spinal anesthesia was administered and was found to be adequate. She was then placed in a dorsal supine position with a leftward tilt, and prepped and draped in a sterile manner.  A foley catheter was placed into her bladder and attached to constant  gravity.  After an adequate timeout was performed, a Pfannenstiel skin incision was made with scalpel and carried through to the underlying layer of fascia. The fascia was incised in the midline and this incision was extended bilaterally using the Mayo scissors. Kocher clamps were applied to the superior aspect of the fascial incision and the underlying rectus muscles were dissected off bluntly. A similar process was carried out on the inferior aspect of the facial incision. The rectus muscles were separated in the midline bluntly and the peritoneum was entered bluntly.  The left rectus muscle was transected 1 cm to help gain access to the abdominal cavity.   A transverse hysterotomy was made with a scalpel and extended bilaterally bluntly. The bladder blade was then removed. The infant was successfully delivered, and cord was clamped and cut and infant was handed over to awaiting neonatology team. Uterine massage was then administered and the placenta delivered intact with three-vessel cord. The uterus was cleared of clot and debris.  The placental bed in the lower uterine was bleeding briskly from one small area.  )-chromic was used to over sew this area and good hemostasis was achieved.  The hysterotomy was closed with 0 vicryl.  A second imbricating suture of 0-Vicryl was used to reinforce the incision and aid in hemostasis.  A small paratubal cyst was removed from the right fallopian tube and sent to pathology.The peritoneum and rectus muscles were noted to be hemostatic.  The fascia was closed with 0-Vicryl in a running fashion with good restoration of anatomy.  The subcutaneus tissue was copiously irrigated.  The skin was closed with staples.  Pt tolerated the procedure will.  All counts were correct x2.  Pt went to the recovery room in stable condition.

## 2013-01-02 NOTE — Anesthesia Preprocedure Evaluation (Signed)
Anesthesia Evaluation  Patient identified by MRN, date of birth, ID band Patient awake    Reviewed: Allergy & Precautions, H&P , NPO status , Patient's Chart, lab work & pertinent test results  Airway Mallampati: II TM Distance: >3 FB Neck ROM: Full    Dental no notable dental hx. (+) Teeth Intact   Pulmonary neg pulmonary ROS,  breath sounds clear to auscultation  Pulmonary exam normal       Cardiovascular negative cardio ROS  Rhythm:Regular Rate:Normal     Neuro/Psych negative neurological ROS  negative psych ROS   GI/Hepatic negative GI ROS, Neg liver ROS,   Endo/Other  negative endocrine ROS  Renal/GU negative Renal ROS  negative genitourinary   Musculoskeletal   Abdominal Normal abdominal exam  (+)   Peds  Hematology negative hematology ROS (+)   Anesthesia Other Findings   Reproductive/Obstetrics (+) Pregnancy Previous C/Section                           Anesthesia Physical Anesthesia Plan  ASA: II  Anesthesia Plan: Spinal   Post-op Pain Management:    Induction:   Airway Management Planned:   Additional Equipment:   Intra-op Plan:   Post-operative Plan:   Informed Consent: I have reviewed the patients History and Physical, chart, labs and discussed the procedure including the risks, benefits and alternatives for the proposed anesthesia with the patient or authorized representative who has indicated his/her understanding and acceptance.   Dental Advisory Given  Plan Discussed with: Anesthesiologist, CRNA and Surgeon  Anesthesia Plan Comments:         Anesthesia Quick Evaluation

## 2013-01-02 NOTE — Anesthesia Procedure Notes (Signed)
Spinal  Patient location during procedure: OR Start time: 01/02/2013 9:47 AM Staffing Anesthesiologist: Cashae Weich A. Performed by: anesthesiologist  Preanesthetic Checklist Completed: patient identified, site marked, surgical consent, pre-op evaluation, timeout performed, IV checked, risks and benefits discussed and monitors and equipment checked Spinal Block Patient position: sitting Prep: site prepped and draped and DuraPrep Patient monitoring: heart rate, cardiac monitor, continuous pulse ox and blood pressure Approach: midline Location: L3-4 Injection technique: single-shot Needle Needle type: Sprotte  Needle gauge: 24 G Needle length: 9 cm Needle insertion depth: 4.5 cm Assessment Sensory level: T4 Additional Notes Patient tolerated procedure well. Adequate sensory level.

## 2013-01-02 NOTE — Anesthesia Postprocedure Evaluation (Signed)
  Anesthesia Post-op Note  Patient: [facility]  Procedure(s) Performed: Procedure(s): CESAREAN SECTION (N/A)  Patient Location: PACU  Anesthesia Type:Spinal  Level of Consciousness: awake, alert  and oriented  Airway and Oxygen Therapy: Patient Spontanous Breathing  Post-op Pain: none  Post-op Assessment: Post-op Vital signs reviewed, Patient's Cardiovascular Status Stable, Respiratory Function Stable, Patent Airway, No signs of Nausea or vomiting, Pain level controlled, No headache, No backache, No residual numbness and No residual motor weakness  Post-op Vital Signs: Reviewed and stable  Complications: No apparent anesthesia complications

## 2013-01-03 LAB — CBC
MCH: 28.9 pg (ref 26.0–34.0)
Platelets: 217 10*3/uL (ref 150–400)
RBC: 2.87 MIL/uL — ABNORMAL LOW (ref 3.87–5.11)
RDW: 14 % (ref 11.5–15.5)

## 2013-01-03 NOTE — Progress Notes (Signed)
I have seen and examined this patient and I agree with the above. Cam Hai 9:50 AM 01/03/2013

## 2013-01-03 NOTE — Progress Notes (Signed)
Subjective:  [id number] Postpartum Day 1: repeat Cesarean Delivery Patient reports tolerating PO and no problems voiding.   Ambulating in room without difficulty Pain controlled with Motrin  Objective: Vital signs in last 24 hours: Temp:  [97.4 F (36.3 C)-98.5 F (36.9 C)] 98 F (36.7 C) (03/08 0600) Pulse Rate:  [58-91] 58 (03/08 0600) Resp:  [13-20] 18 (03/08 0600) BP: (92-126)/(36-89) 107/70 mmHg (03/08 0600) SpO2:  [97 %-100 %] 100 % (03/08 0600) Weight:  [78.472 kg (173 lb)] 78.472 kg (173 lb) (03/07 1421)  Physical Exam:  General: alert, cooperative and no distress Lochia: appropriate Uterine Fundus: firm Incision: under dressing. Dressing CDI DVT Evaluation: No evidence of DVT seen on physical exam. Negative Homan's sign. No cords or calf tenderness. No significant calf/ankle edema.   Recent Labs  01/01/13 1025 01/03/13 0510  HGB 10.6* 8.2*  HCT 31.6* 24.9*    Assessment/Plan: Status post Cesarean section. Doing well postoperatively.  Continue current care. Considering discharge tomorrow Considering Mirena for contraception  Alene Mires 01/03/2013, 7:35 AM

## 2013-01-03 NOTE — Anesthesia Postprocedure Evaluation (Signed)
  Anesthesia Post-op Note  Patient: Katie Love  Procedure(s) Performed: Procedure(s): CESAREAN SECTION (N/A)  Patient Location: Mother/Baby  Anesthesia Type:Spinal  Level of Consciousness: awake, alert  and oriented  Airway and Oxygen Therapy: Patient Spontanous Breathing  Post-op Pain: mild  Post-op Assessment: Patient's Cardiovascular Status Stable, Respiratory Function Stable, Patent Airway, No signs of Nausea or vomiting and Pain level controlled  Post-op Vital Signs: stable  Complications: No apparent anesthesia complications

## 2013-01-04 LAB — HEPATITIS B SURFACE ANTIGEN: Hepatitis B Surface Ag: NEGATIVE

## 2013-01-04 NOTE — Progress Notes (Signed)
Subjective: Postpartum Day 2: Cesarean Delivery Patient reports incisional pain, tolerating PO and no problems voiding.  Reports breastfeeding well.  Uncertain about discharge home today.    Objective: Vital signs in last 24 hours: Temp:  [97.7 F (36.5 C)-98.2 F (36.8 C)] 98.2 F (36.8 C) (03/09 0510) Pulse Rate:  [61-78] 61 (03/09 0510) Resp:  [18-20] 18 (03/09 0510) BP: (99-197)/(63-68) 99/63 mmHg (03/09 0510) SpO2:  [100 %] 100 % (03/08 1008)  Physical Exam:  General: alert, cooperative and appears stated age CVS:  RRR, without murmur, gallops, or rubs Lungs:  CTA bilat ABD:  +BSx4; hyperactive, normal Lochia: appropriate Uterine Fundus: firm Incision: no significant drainage, old drainage seen, no new blood; intact DVT Evaluation: No evidence of DVT seen on physical exam. Negative Homan's sign.   Recent Labs  01/01/13 1025 01/03/13 0510  HGB 10.6* 8.2*  HCT 31.6* 24.9*    Assessment/Plan: Status post Cesarean section. Doing well postoperatively.  Continue current care.  [facility] 01/04/2013, 8:06 AM

## 2013-01-05 ENCOUNTER — Encounter (HOSPITAL_COMMUNITY): Payer: Self-pay | Admitting: Obstetrics & Gynecology

## 2013-01-05 LAB — RUBELLA SCREEN: Rubella: 13.5 Index — ABNORMAL HIGH (ref <0.90)

## 2013-01-05 MED ORDER — IBUPROFEN 600 MG PO TABS: 600.0000 mg | ORAL_TABLET | Freq: Four times a day (QID) | ORAL | Status: DC | PRN

## 2013-01-05 MED ORDER — FERROUS SULFATE 300 (60 FE) MG PO TABS: 300.0000 mg | ORAL_TABLET | Freq: Two times a day (BID) | ORAL | Status: DC

## 2013-01-05 MED ORDER — OXYCODONE-ACETAMINOPHEN 5-325 MG PO TABS: 1.0000 | ORAL_TABLET | ORAL | Status: DC | PRN

## 2013-01-05 NOTE — Discharge Summary (Signed)
Obstetric Discharge Summary Reason for Admission: cesarean section Prenatal Procedures: none Intrapartum Procedures: cesarean: low cervical, transverse Postpartum Procedures: none Complications-Operative and Postpartum: none Hemoglobin  Date Value Range Status  01/03/2013 8.2* 12.0 - 15.0 g/dL Final     DELTA CHECK NOTED     REPEATED TO VERIFY     HCT  Date Value Range Status  01/03/2013 24.9* 36.0 - 46.0 % Final    Physical Exam:  General: alert, cooperative and no distress Lochia: appropriate Uterine Fundus: firm Incision: healing well, no significant drainage, no dehiscence DVT Evaluation: No evidence of DVT seen on physical exam. Negative Homan's sign. No cords or calf tenderness.  Discharge Diagnoses: Term Pregnancy-delivered and RLTCS  Discharge Information: Date: 01/05/2013 Activity: unrestricted and Per instructions Diet: routine Medications: Ibuprofen, Iron and Percocet Condition: stable Instructions: refer to practice specific booklet Discharge to: home   Newborn Data: Live born female  Birth Weight: 7 lb 4.6 oz (3305 g) APGAR: 9, 9  Home with mother. I have seen and examined this patient and agree the above assessment. CRESENZO-DISHMAN,FRANCES 01/05/2013 7:50 AM    Kevin Fenton 01/05/2013, 7:33 AM

## 2013-01-22 ENCOUNTER — Ambulatory Visit (INDEPENDENT_AMBULATORY_CARE_PROVIDER_SITE_OTHER): Payer: BC Managed Care – PPO | Admitting: Obstetrics & Gynecology

## 2013-01-22 ENCOUNTER — Encounter: Payer: Self-pay | Admitting: Obstetrics & Gynecology

## 2013-01-22 ENCOUNTER — Encounter: Payer: Self-pay | Admitting: *Deleted

## 2013-01-22 VITALS — BP 138/91 | HR 59 | Resp 16 | Ht 62.0 in | Wt 156.0 lb

## 2013-01-22 DIAGNOSIS — Z3480 Encounter for supervision of other normal pregnancy, unspecified trimester: Secondary | ICD-10-CM

## 2013-01-22 DIAGNOSIS — N898 Other specified noninflammatory disorders of vagina: Secondary | ICD-10-CM

## 2013-01-22 NOTE — Patient Instructions (Signed)
Intrauterine Device Information  An intrauterine device (IUD) is inserted into your uterus and prevents pregnancy. There are 2 types of IUDs available:  · Copper IUD. This type of IUD is wrapped in copper wire and is placed inside the uterus. Copper makes the uterus and fallopian tubes produce a fluid that kills sperm. The copper IUD can stay in place for 10 years.  · Hormone IUD. This type of IUD contains the hormone progestin (synthetic progesterone). The hormone thickens the cervical mucus and prevents sperm from entering the uterus, and it also thins the uterine lining to prevent implantation of a fertilized egg. The hormone can weaken or kill the sperm that get into the uterus. The hormone IUD can stay in place for 5 years.  Your caregiver will make sure you are a good candidate for a contraceptive IUD. Discuss with your caregiver the possible side effects.  ADVANTAGES  · It is highly effective, reversible, long-acting, and low maintenance.  · There are no estrogen-related side effects.  · An IUD can be used when breastfeeding.  · It is not associated with weight gain.  · It works immediately after insertion.  · The copper IUD does not interfere with your female hormones.  · The progesterone IUD can make heavy menstrual periods lighter.  · The progesterone IUD can be used for 5 years.  · The copper IUD can be used for 10 years.  DISADVANTAGES  · The progesterone IUD can be associated with irregular bleeding patterns.  · The copper IUD can make your menstrual flow heavier and more painful.  · You may experience cramping and vaginal bleeding after insertion.  Document Released: 09/18/2004 Document Revised: 01/07/2012 Document Reviewed: 02/17/2011  ExitCare® Patient Information ©2013 [employer].

## 2013-01-22 NOTE — Progress Notes (Signed)
  Subjective:    Patient ID: Katie Love, female    DOB: [birth year], 27 y.o.   MRN: [id number]  HPI  Pt presents for incision check and vaginal odor.  Pt noticed a small midline separation of the incision and pt placed steristrips.  They have been in place for 2 weeks.  Pt denies pain nor discharge from the incision site.  Pt is c/o vaginal odor and discharge.  Review of Systems  Constitutional: Negative.   Respiratory: Negative.   Cardiovascular: Negative.   Gastrointestinal: Negative for abdominal pain.  Genitourinary: Positive for vaginal discharge.  Neurological:       Pain and numbness in the hands  Psychiatric/Behavioral: Negative.        Objective:   Physical Exam  Vitals reviewed. Constitutional: She appears well-developed and well-nourished. No distress.  HENT:  Head: Normocephalic and atraumatic.  Eyes: Conjunctivae are normal.  Pulmonary/Chest: Effort normal.  Abdominal: Soft. She exhibits no distension and no mass. There is no tenderness. There is no rebound and no guarding.  Incision well healed  Genitourinary:  Frothy discharge in vagina.  Cervix closed.  Musculoskeletal: She exhibits no edema.  Probable carpel tunnel   Skin: Skin is warm and dry.  Psychiatric: She has a normal mood and affect.          Assessment & Plan:  27 yo female s/p c/s 3 weeks ago  Incision well healed BD affirm for discharge Offered referral to orthopedics but pt would like to wait. Info on Copper T IUD given.

## 2013-01-23 ENCOUNTER — Telehealth: Payer: Self-pay | Admitting: *Deleted

## 2013-01-23 NOTE — Telephone Encounter (Signed)
Called pt to adv wet prep was normal - LMOM

## 2013-01-26 ENCOUNTER — Telehealth: Payer: Self-pay | Admitting: *Deleted

## 2013-01-26 NOTE — Telephone Encounter (Signed)
Erroneous encounter

## 2013-02-12 ENCOUNTER — Ambulatory Visit (INDEPENDENT_AMBULATORY_CARE_PROVIDER_SITE_OTHER): Payer: BC Managed Care – PPO | Admitting: Obstetrics & Gynecology

## 2013-02-12 ENCOUNTER — Encounter: Payer: Self-pay | Admitting: Obstetrics & Gynecology

## 2013-02-12 VITALS — BP 134/86 | HR 57 | Resp 16 | Ht 62.0 in | Wt 150.0 lb

## 2013-02-12 DIAGNOSIS — R209 Unspecified disturbances of skin sensation: Secondary | ICD-10-CM

## 2013-02-12 DIAGNOSIS — R200 Anesthesia of skin: Secondary | ICD-10-CM

## 2013-02-12 MED ORDER — ETONOGESTREL-ETHINYL ESTRADIOL 0.12-0.015 MG/24HR VA RING: VAGINAL_RING | VAGINAL | Status: DC

## 2013-02-12 NOTE — Progress Notes (Signed)
  Subjective:     Katie Love is a 27 y.o. female who presents for a postpartum visit. She is 6 weeks postpartum following a low cervical transverse Cesarean section. I have fully reviewed the prenatal and intrapartum course. The delivery was at 39 gestational weeks. Outcome: repeat cesarean section, low transverse incision. Anesthesia: spinal. Postpartum course has been uneventful. Baby's course has been uneventful. Baby is feeding by breast. Bleeding no bleeding. Bowel function is normal. Bladder function is normal. Patient is sexually active. Contraception method is rhythm method. Postpartum depression screening: negative.  Pt continues to have bilateral carpal tunnel.  The following portions of the patient's history were reviewed and updated as appropriate: allergies, current medications, past family history, past medical history, past social history, past surgical history and problem list.  Review of Systems Pt c/ bilateral hand numbness, swelling.  Objective:    BP 134/86  Pulse 57  Resp 16  Ht 5\' 2"  (1.575 m)  Wt 150 lb (68.04 kg)  BMI 27.43 kg/m2  Breastfeeding? Yes  General:  alert, cooperative and no distress   Breasts:  inspection negative, no nipple discharge or bleeding, no masses or nodularity palpable  Lungs: clear to auscultation bilaterally  Heart:  regular rate and rhythm  Abdomen: soft, non-tender; bowel sounds normal; no masses,  no organomegaly   Vulva:  normal  Vagina: normal vagina  Cervix:  no lesions  Corpus: normal  Adnexa:  no mass, fullness, tenderness  Rectal Exam: Not performed.        Assessment:     nml postpartum exam. Pap smear not done at today's visit.   Plan:    1. Contraception: NuvaRing vaginal inserts.  Pt warned of decreased milk production. 2. Hand surgeon referral. 3. Follow up in: 1 year or as needed.

## 2013-03-05 ENCOUNTER — Encounter: Payer: Self-pay | Admitting: *Deleted

## 2014-01-13 ENCOUNTER — Telehealth: Payer: Self-pay | Admitting: *Deleted

## 2014-01-13 ENCOUNTER — Other Ambulatory Visit (INDEPENDENT_AMBULATORY_CARE_PROVIDER_SITE_OTHER): Payer: BC Managed Care – PPO

## 2014-01-13 DIAGNOSIS — Z021 Encounter for pre-employment examination: Secondary | ICD-10-CM

## 2014-01-13 DIAGNOSIS — Z0289 Encounter for other administrative examinations: Secondary | ICD-10-CM

## 2014-01-13 NOTE — Progress Notes (Signed)
Pt here for pre-employment form to be filled out.  She doesn't have documentation of her Hepatitis B series so a titre was drawn.

## 2014-01-13 NOTE — Telephone Encounter (Signed)
Pt here to have preemployment form filled out.  She had no record of her Hepatitis series and therefore a titre was drawn and will contact pt with results.

## 2014-01-14 LAB — HEPATITIS B SURFACE ANTIBODY,QUALITATIVE: Hep B S Ab: POSITIVE — AB

## 2014-01-15 ENCOUNTER — Encounter: Payer: Self-pay | Admitting: Family Medicine

## 2014-01-15 ENCOUNTER — Ambulatory Visit (INDEPENDENT_AMBULATORY_CARE_PROVIDER_SITE_OTHER): Payer: BC Managed Care – PPO | Admitting: Family Medicine

## 2014-01-15 VITALS — BP 121/76 | HR 74 | Ht 62.0 in | Wt 154.0 lb

## 2014-01-15 DIAGNOSIS — Z021 Encounter for pre-employment examination: Secondary | ICD-10-CM

## 2014-01-15 DIAGNOSIS — Z0000 Encounter for general adult medical examination without abnormal findings: Secondary | ICD-10-CM

## 2014-01-15 DIAGNOSIS — Z13220 Encounter for screening for lipoid disorders: Secondary | ICD-10-CM

## 2014-01-15 DIAGNOSIS — Z131 Encounter for screening for diabetes mellitus: Secondary | ICD-10-CM

## 2014-01-15 DIAGNOSIS — Z0289 Encounter for other administrative examinations: Secondary | ICD-10-CM

## 2014-01-15 NOTE — Progress Notes (Signed)
CC: Katie Love is a 28 y.o. 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  Past medical history significant only for cesarean section x2.  Note tobacco, recreational drug use nor alcohol use.  Review of Systems - General ROS: negative for - chills, fever, night sweats, weight gain or weight loss Ophthalmic ROS: negative for - decreased vision Psychological ROS: negative for - anxiety or depression ENT ROS: negative for - hearing change, nasal congestion, tinnitus or allergies Hematological and Lymphatic ROS: negative for - bleeding problems, bruising or swollen lymph nodes Breast ROS: negative Respiratory ROS: no cough, shortness of breath, or wheezing Cardiovascular ROS: no chest pain or dyspnea on exertion Gastrointestinal ROS: no abdominal pain, change in bowel habits, or black or bloody stools Genito-Urinary ROS: negative for - genital discharge, genital ulcers, incontinence or abnormal bleeding from genitals Musculoskeletal ROS: negative for - joint pain or muscle pain Neurological ROS: negative for - headaches or memory loss Dermatological ROS: negative for lumps, mole changes, rash and skin lesion changes  Past Medical History  Diagnosis Date  . No pertinent past medical history   . Medical history non-contributory     Past Surgical History  Procedure Laterality Date  . Cesarean section  2008  . Cesarean section N/A 01/02/2013   Procedure: CESAREAN SECTION;  Surgeon: Lesly Dukes, MD;  Location: [facility];  Service: Obstetrics;  Laterality: N/A;   History reviewed. No pertinent family history.  History   Social History  . Marital Status: Single    Spouse Name: N/A    Number of Children: N/A  . Years of Education: N/A   Occupational History  . Not on file.   Social History Main Topics  . Smoking status: Never Smoker   . Smokeless tobacco: Never Used  . Alcohol Use: No  . Drug Use: No  . Sexual Activity: Yes    Birth Control/ Protection: None   Other Topics Concern  . Not on file   Social History Narrative  . No narrative on file     Objective: BP 121/76  Pulse 74  Ht 5\' 2"  (1.575 m)  Wt 154 lb (69.854 kg)  BMI 28.16 kg/m2  General: No Acute Distress HEENT: Atraumatic, normocephalic, conjunctivae normal without scleral icterus.  No nasal discharge, hearing grossly intact, TMs with good landmarks bilaterally with no middle ear abnormalities, posterior pharynx clear without oral lesions. Neck: Supple, trachea midline, no cervical nor supraclavicular adenopathy. Pulmonary: Clear to auscultation bilaterally without wheezing, rhonchi, nor rales. Cardiac: Regular rate and rhythm.  No murmurs, rubs, nor gallops. No peripheral edema.  2+ peripheral pulses bilaterally. Abdomen: Bowel sounds normal.  No masses.  Non-tender without rebound.  Negative Murphy's sign. GU: Deferred to OB/GYN per patient request  MSK: Grossly intact, no signs of weakness.  Full strength throughout upper and lower extremities.  Full ROM in upper and lower extremities.  No midline spinal tenderness. Neuro: Gait unremarkable, CN II-XII grossly intact.  C5-C6 Reflex 2/4 Bilaterally, L4 Reflex 2/4 Bilaterally.  Cerebellar  function intact. Skin: No rashes. Psych: Alert and oriented to person/place/time.  Thought process normal. No anxiety/depression.   Assessment & Plan: Katie Love was seen today for establish care and request  physical.  Diagnoses and associated orders for this visit:  Lipid screening - Lipid panel - BASIC METABOLIC PANEL WITH GFR  Diabetes mellitus screening - Lipid panel - BASIC METABOLIC PANEL WITH GFR  Physical exam, pre-employment    Healthy lifestyle interventions including but not limited to regular exercise, a healthy low fat diet, moderation of salt intake, the dangers of tobacco/alcohol/recreational drug use, nutrition supplementation, and accident avoidance were discussed with the patient and a handout was provided for future reference.  Due for routine dyslipidemia and diabetic screening   Return if symptoms worsen or fail to improve.

## 2014-01-26 ENCOUNTER — Encounter: Payer: Self-pay | Admitting: Obstetrics & Gynecology

## 2014-01-26 ENCOUNTER — Ambulatory Visit (INDEPENDENT_AMBULATORY_CARE_PROVIDER_SITE_OTHER): Payer: BC Managed Care – PPO | Admitting: Obstetrics & Gynecology

## 2014-01-26 VITALS — BP 121/79 | HR 75 | Resp 16 | Ht 63.0 in | Wt 152.0 lb

## 2014-01-26 DIAGNOSIS — Z30430 Encounter for insertion of intrauterine contraceptive device: Secondary | ICD-10-CM

## 2014-01-26 DIAGNOSIS — Z01812 Encounter for preprocedural laboratory examination: Secondary | ICD-10-CM

## 2014-01-26 LAB — POCT URINE PREGNANCY: Preg Test, Ur: NEGATIVE

## 2014-01-26 MED ORDER — LEVONORGESTREL 20 MCG/24HR IU IUD
INTRAUTERINE_SYSTEM | Freq: Once | INTRAUTERINE | Status: AC
  Administered 2014-01-26: 16:00:00 via INTRAUTERINE

## 2014-01-26 NOTE — Progress Notes (Signed)
IUD Procedure Note Patient identified, informed consent performed.  Discussed risks of irregular bleeding, cramping, infection, malpositioning or misplacement of the IUD outside the uterus which may require further procedures. Time out was performed.  Urine pregnancy test negative.  Speculum placed in the vagina.  Cervix visualized.  Cleaned with Betadine x 2.  Grasped anteriorly with a single tooth tenaculum.  Uterus sounded to 8 cm.  Mirena IUD placed per manufacturer's recommendations.  Strings trimmed to 3 cm. Tenaculum was removed, good hemostasis noted.  Patient tolerated procedure well.   Patient was given post-procedure instructions and the Mirena care card with expiration date.  Patient was also asked to check IUD strings periodically and follow up in 4-6 weeks for IUD check.  Pt needs pap smear at that visit

## 2014-01-26 NOTE — Addendum Note (Signed)
Addended by: Granville LewisLARK, Taletha Twiford L on: 01/26/2014 04:21 PM   Modules accepted: Orders

## 2014-07-12 ENCOUNTER — Telehealth: Payer: Self-pay | Admitting: *Deleted

## 2014-07-12 DIAGNOSIS — N898 Other specified noninflammatory disorders of vagina: Secondary | ICD-10-CM

## 2014-07-12 MED ORDER — FLUCONAZOLE 150 MG PO TABS: 150.0000 mg | ORAL_TABLET | Freq: Once | ORAL | Status: DC

## 2014-07-12 NOTE — Telephone Encounter (Signed)
Vaginal Itching - Called in Diflucan per Dr Penne Lash

## 2014-07-19 ENCOUNTER — Encounter: Payer: Self-pay | Admitting: Obstetrics & Gynecology

## 2014-07-19 ENCOUNTER — Ambulatory Visit (INDEPENDENT_AMBULATORY_CARE_PROVIDER_SITE_OTHER): Payer: BC Managed Care – PPO | Admitting: Obstetrics & Gynecology

## 2014-07-19 VITALS — BP 111/69 | HR 67 | Resp 16 | Ht 62.0 in | Wt 161.0 lb

## 2014-07-19 DIAGNOSIS — Z30432 Encounter for removal of intrauterine contraceptive device: Secondary | ICD-10-CM

## 2014-07-19 DIAGNOSIS — Z30431 Encounter for routine checking of intrauterine contraceptive device: Secondary | ICD-10-CM | POA: Diagnosis not present

## 2014-07-19 DIAGNOSIS — Z308 Encounter for other contraceptive management: Secondary | ICD-10-CM

## 2014-07-19 MED ORDER — DOXYCYCLINE HYCLATE 100 MG PO CAPS: 100.0000 mg | ORAL_CAPSULE | Freq: Two times a day (BID) | ORAL | Status: DC

## 2014-07-22 NOTE — Progress Notes (Signed)
   Subjective:    Patient ID: Katie Love, female    DOB: February 17, 1986, 28 y.o.   MRN: [id number]  Gynecologic Exam    Patient complains of recurrent yeast infections with a Mirina IUD. She had a [name] IUD prior to her last pregnancy and had some of the same complaints. The summary was placed her yeast infections have returned. Patient would like to [name] IUD removed. We had an extensive conversation about the options and she would like to try birth control pills. At some point she may consider a [name] IUD since it is warm and free. Patient did not have menses with a Marina IUD. She is happy about this aspect up the [place].  Review of Systems    recurrent vaginitis symptoms Denies bleeding or menstrual problem Denies pelvic pain  Objective:   Physical Exam  Vitals reviewed. Constitutional: She is oriented to person, place, and time. She appears well-developed and well-nourished.  HENT:  Head: Normocephalic and atraumatic.  Eyes: Conjunctivae are normal.  Abdominal: Soft. She exhibits no distension and no mass. There is no tenderness.  Genitourinary: Vagina normal and uterus normal. No vaginal discharge found.  IUD strings not seen  Musculoskeletal: She exhibits no edema.  Neurological: She is alert and oriented to person, place, and time.  Skin: Skin is warm and dry.  Psychiatric: She has a normal mood and affect.      Bedside ultrasound shows a Marina in the endometrial canal.    Assessment & Plan:  GYNECOLOGY CLINIC PROCEDURE NOTE  Katie Love is a 28 y.o. [id number] here for Mirena IUD removal.  IUD Removal and Reinsertion  Patient identified, informed consent performed. Time out was performed. Speculum placed in the vagina. Cervix visualized. Cleaned with Betadine x 2. Grasped anteriorly with a single tooth tenaculum.  The cervical os was not stenotic and admitted Pap smear brush. The Pap smear brush was not able to bring the strings into view. A forcep and IUD  hook was used to try to grasp the IUD strings an IUD itself without success. USl guidance was then also utilized to help try to grasp IUD. For patient comfort 10 cc of 2% lidocaine were injected at 3 and 9:00. This help with some of the pain but the patient was not able to tolerate any further attempts at removal of the IUD.  Patient will be taken to the operating room for IUD removal.  Patient was discharged in stable condition. Patient will start 3 days of doxycycline given the uterine manipulation that occurred during the IUD removal attempt.

## 2014-07-23 ENCOUNTER — Other Ambulatory Visit: Payer: Self-pay | Admitting: Obstetrics & Gynecology

## 2014-08-04 ENCOUNTER — Ambulatory Visit (HOSPITAL_COMMUNITY)
Admission: RE | Admit: 2014-08-04 | Payer: BC Managed Care – PPO | Source: Ambulatory Visit | Admitting: Obstetrics & Gynecology

## 2014-08-04 ENCOUNTER — Encounter (HOSPITAL_COMMUNITY): Admission: RE | Payer: Self-pay | Source: Ambulatory Visit

## 2014-08-04 SURGERY — REMOVAL, INTRAUTERINE DEVICE
Anesthesia: Choice | Site: Vagina

## 2014-08-05 ENCOUNTER — Encounter: Payer: Self-pay | Admitting: *Deleted

## 2014-08-30 ENCOUNTER — Encounter: Payer: Self-pay | Admitting: Obstetrics & Gynecology

## 2016-11-22 ENCOUNTER — Encounter: Payer: Self-pay | Admitting: Obstetrics & Gynecology

## 2016-11-22 ENCOUNTER — Ambulatory Visit (INDEPENDENT_AMBULATORY_CARE_PROVIDER_SITE_OTHER): Payer: 59 | Admitting: Obstetrics & Gynecology

## 2016-11-22 VITALS — BP 120/82 | HR 72 | Resp 16 | Ht 62.0 in | Wt 168.0 lb

## 2016-11-22 DIAGNOSIS — Z01419 Encounter for gynecological examination (general) (routine) without abnormal findings: Secondary | ICD-10-CM

## 2016-11-22 DIAGNOSIS — Z30432 Encounter for removal of intrauterine contraceptive device: Secondary | ICD-10-CM | POA: Diagnosis not present

## 2016-11-22 DIAGNOSIS — Z0000 Encounter for general adult medical examination without abnormal findings: Secondary | ICD-10-CM

## 2016-11-22 DIAGNOSIS — Z30433 Encounter for removal and reinsertion of intrauterine contraceptive device: Secondary | ICD-10-CM

## 2016-11-22 NOTE — Progress Notes (Signed)
Subjective:    Katie Love is a 31 y.o. A A P2(4 and 759 yo kids) female who presents for an annual exam. The patient has no complaints today. She would like her Mirena removed so that she can conceive. The patient is sexually active. GYN screening history: last pap: was normal. The patient wears seatbelts: yes. The patient participates in regular exercise: yes. Has the patient ever been transfused or tattooed?: no. The patient reports that there is not domestic violence in her life.   Menstrual History: OB History    Gravida Para Term Preterm AB Living   2 2 2     2    SAB TAB Ectopic Multiple Live Births           2      Menarche age: 89 No LMP recorded. Patient is not currently having periods (Reason: IUD).    The following portions of the patient's history were reviewed and updated as appropriate: allergies, current medications, past family history, past medical history, past social history, past surgical history and problem list.  Review of Systems Pertinent items are noted in HPI.    Objective:    BP 120/82   Pulse 72   Resp 16   Ht 5\' 2"  (1.575 m)   Wt 168 lb (76.2 kg)   BMI 30.73 kg/m   General Appearance:    Alert, cooperative, no distress, appears stated age  Head:    Normocephalic, without obvious abnormality, atraumatic  Eyes:    PERRL, conjunctiva/corneas clear, EOM's intact, fundi    benign, both eyes  Ears:    Normal TM's and external ear canals, both ears  Nose:   Nares normal, septum midline, mucosa normal, no drainage    or sinus tenderness  Throat:   Lips, mucosa, and tongue normal; teeth and gums normal  Neck:   Supple, symmetrical, trachea midline, no adenopathy;    thyroid:  no enlargement/tenderness/nodules; no carotid   bruit or JVD  Back:     Symmetric, no curvature, ROM normal, no CVA tenderness  Lungs:     Clear to auscultation bilaterally, respirations unlabored  Chest Wall:    No tenderness or deformity   Heart:    Regular rate and rhythm, S1  and S2 normal, no murmur, rub   or gallop  Breast Exam:    No tenderness, masses, or nipple abnormality  Abdomen:     Soft, non-tender, bowel sounds active all four quadrants,    no masses, no organomegaly  Genitalia:    Normal female without lesion, discharge or tenderness, IUD strings not visible. I used the IUD hook without success. U/S showed proper placement. She was unable to tolerate the attempt at removal.     Extremities:   Extremities normal, atraumatic, no cyanosis or edema  Pulses:   2+ and symmetric all extremities  Skin:   Skin color, texture, turgor normal, no rashes or lesions  Lymph nodes:   Cervical, supraclavicular, and axillary nodes normal  Neurologic:   CNII-XII intact, normal strength, sensation and reflexes    throughout  .    Assessment:    Healthy female exam.   Desire for IUD removal   Plan:     Thin prep Pap smear. with cotesting [facility]s [facility] Rec start MVI with folic acid asap

## 2016-11-27 LAB — CYTOLOGY - PAP
Diagnosis: NEGATIVE
HPV: NOT DETECTED

## 2016-12-24 ENCOUNTER — Telehealth: Payer: Self-pay

## 2016-12-24 MED ORDER — MISOPROSTOL 200 MCG PO TABS: ORAL_TABLET | ORAL | 0 refills | Status: DC

## 2016-12-24 MED ORDER — DIAZEPAM 5 MG PO TABS: ORAL_TABLET | ORAL | 0 refills | Status: DC

## 2016-12-24 NOTE — Telephone Encounter (Signed)
Patient called and explained hysteroscopy procedure to her. She will not be getting another IUD.  Explained that I will send in two medication for her to get tomorrow.  First medication is cytotec which she is to place in the vagina at bedtime the night before the procedure.  Next one is Valium and she is to bring the prescription with her to the appointment and will be instructed when to take it. Patient confirms she will have a driver. Patient asks if she will have anthesia and explained to her that there is not any in the office procedure but we will use all measures to make her comfortable. Patient states understanding. Armandina StammerJennifer Lorrane Mccay RNBSN

## 2016-12-26 ENCOUNTER — Ambulatory Visit (INDEPENDENT_AMBULATORY_CARE_PROVIDER_SITE_OTHER): Payer: 59 | Admitting: Obstetrics & Gynecology

## 2016-12-26 ENCOUNTER — Encounter: Payer: Self-pay | Admitting: Obstetrics & Gynecology

## 2016-12-26 VITALS — BP 109/55 | HR 69 | Ht 62.0 in | Wt 173.0 lb

## 2016-12-26 DIAGNOSIS — T8332XD Displacement of intrauterine contraceptive device, subsequent encounter: Secondary | ICD-10-CM

## 2016-12-26 MED ORDER — KETOROLAC TROMETHAMINE 30 MG/ML IJ SOLN
30.0000 mg | Freq: Once | INTRAMUSCULAR | Status: AC
  Administered 2016-12-26: 30 mg via INTRAMUSCULAR

## 2016-12-26 NOTE — Patient Instructions (Signed)
Hysteroscopy, Care After  Refer to this sheet in the next few weeks. These instructions provide you with information on caring for yourself after your procedure. Your health care provider may also give you more specific instructions. Your treatment has been planned according to current medical practices, but problems sometimes occur. Call your health care provider if you have any problems or questions after your procedure.  What can I expect after the procedure?  After your procedure, it is typical to have the following:  · You may have some cramping. This normally lasts for a couple days.  · You may have bleeding. This can vary from light spotting for a few days to menstrual-like bleeding for 3-7 days.    Follow these instructions at home:  · Rest for the first 1-2 days after the procedure.  · Only take over-the-counter or prescription medicines as directed by your health care provider. Do not take aspirin. It can increase the chances of bleeding.  · Take showers instead of baths for 2 weeks or as directed by your health care provider.  · Do not drive for 24 hours or as directed.  · Do not drink alcohol while taking pain medicine.  · Do not use tampons, douche, or have sexual intercourse for 2 weeks or until your health care provider says it is okay.  · Take your temperature twice a day for 4-5 days. Write it down each time.  · Follow your health care provider's advice about diet, exercise, and lifting.  · If you develop constipation, you may:  ? Take a mild laxative if your health care provider approves.  ? Add bran foods to your diet.  ? Drink enough fluids to keep your urine clear or pale yellow.  · Try to have someone with you or available to you for the first 24-48 hours, especially if you were given a general anesthetic.  · Follow up with your health care provider as directed.  Contact a health care provider if:  · You feel dizzy or lightheaded.  · You feel sick to your stomach (nauseous).  · You have  abnormal vaginal discharge.  · You have a rash.  · You have pain that is not controlled with medicine.  Get help right away if:  · You have bleeding that is heavier than a normal menstrual period.  · You have a fever.  · You have increasing cramps or pain, not controlled with medicine.  · You have new belly (abdominal) pain.  · You pass out.  · You have pain in the tops of your shoulders (shoulder strap areas).  · You have shortness of breath.  This information is not intended to replace advice given to you by your health care provider. Make sure you discuss any questions you have with your health care provider.  Document Released: 08/05/2013 Document Revised: 03/22/2016 Document Reviewed: 05/14/2013  Elsevier Interactive Patient Education © 2017 [employer]

## 2016-12-26 NOTE — Progress Notes (Signed)
INDICATIONS: 31 y.o. [id number]  here for scheduled surgery for retained IUD. Unable to locate strings or remove in ofc.   Risks of surgery were discussed with the patient including but not limited to: bleeding which may require transfusion; infection which may require antibiotics; injury to uterus or surrounding organs; intrauterine scarring which may impair future fertility; need for additional procedures including laparotomy or laparoscopy; and other postoperative/anesthesia complications. Written informed consent was obtained.    FINDINGS:  A 8 week size uterus.  Diffuse proliferative endometrium.  Normal ostia bilaterally. IUD noted with strings in the endometrium  ANESTHESIA:   General, paracervical block. COMPLICATIONS:  None immediate.  PROCEDURE DETAILS:  The patient was taken to the procedure room where she received Toradol 10 mg IM. She also took Valium 10mg  in the waiting area just prior to the procedure and cytotec 400mcg at home prior to the visit.  After an adequate timeout was performed, she was placed in the dorsal lithotomy position and examined; then prepped and draped in the sterile manner.   A speculum was then placed in the patient's vagina.  A paracervical block of 10cc of 2% lidocaine with epinephrine was placed with 5cc at both 5 adn 7 o'clock.  A single tooth tenaculum was applied to the anterior lip of the cervix.   A 5mm hysteroscope was inserted under direct visualization using normal saline as a distending medium.  The uterine cavity was carefully examined, both ostia were recognized, and diffusely proliferative endometrium was noted. The IUD was noted and removed in its entirety using the hysteroscopic grasping forceps There were no masses noted.  The tenaculum was removed from the anterior lip of the cervix and the vaginal speculum was removed after noting good hemostasis.  The patient tolerated the procedure well.   There were no immediate complications.  The patient will be  discharged to home. Routine postoperative instructions given. [facility]. She will begin PNV as she intends to try to conceive.  Kaylen Nghiem L. Harraway-Smith, M.D., Evern CoreFACOG

## 2016-12-28 ENCOUNTER — Encounter: Payer: Self-pay | Admitting: Obstetrics & Gynecology

## 2017-08-27 ENCOUNTER — Encounter: Payer: 59 | Admitting: Obstetrics & Gynecology

## 2017-09-04 ENCOUNTER — Encounter: Payer: Self-pay | Admitting: Obstetrics & Gynecology

## 2017-10-09 ENCOUNTER — Ambulatory Visit (INDEPENDENT_AMBULATORY_CARE_PROVIDER_SITE_OTHER): Payer: BLUE CROSS/BLUE SHIELD | Admitting: Obstetrics & Gynecology

## 2017-10-09 ENCOUNTER — Encounter: Payer: Self-pay | Admitting: Obstetrics & Gynecology

## 2017-10-09 VITALS — BP 112/72 | HR 75 | Wt 175.0 lb

## 2017-10-09 DIAGNOSIS — Z3490 Encounter for supervision of normal pregnancy, unspecified, unspecified trimester: Secondary | ICD-10-CM

## 2017-10-09 DIAGNOSIS — Z3480 Encounter for supervision of other normal pregnancy, unspecified trimester: Secondary | ICD-10-CM | POA: Insufficient documentation

## 2017-10-09 DIAGNOSIS — Z3492 Encounter for supervision of normal pregnancy, unspecified, second trimester: Secondary | ICD-10-CM | POA: Diagnosis not present

## 2017-10-09 DIAGNOSIS — Z113 Encounter for screening for infections with a predominantly sexual mode of transmission: Secondary | ICD-10-CM | POA: Diagnosis not present

## 2017-10-10 LAB — AFP, QUAD SCREEN
AFP: 68.3 ng/mL
Age Alone: 1
CURR GEST AGE: 18.9 wk
Down Syndrome Scr Risk Est: 1
HCG, Total: 60.96 IU/mL
INH: 313 pg/mL
MATERNAL WT: 175 [lb_av]
MOM FOR INH: 2.05
MoM for AFP: 1.4
MoM for hCG: 2.72
Trisomy 18 (Edward) Syndrome Interp.: <1
Twins-AFP: 1
UE3 MOM: 0.99
UE3 VALUE: 1.32 ng/mL

## 2017-10-10 LAB — OBSTETRIC PANEL
Antibody Screen: NOT DETECTED
BASOS ABS: 8 {cells}/uL (ref 0–200)
BASOS PCT: 0.1 %
EOS ABS: 53 {cells}/uL (ref 15–500)
Eosinophils Relative: 0.7 %
HCT: 30.6 % — ABNORMAL LOW (ref 35.0–45.0)
HEMOGLOBIN: 10.3 g/dL — AB (ref 11.7–15.5)
Hepatitis B Surface Ag: NONREACTIVE
Lymphs Abs: 1725 cells/uL (ref 850–3900)
MCH: 30.6 pg (ref 27.0–33.0)
MCHC: 33.7 g/dL (ref 32.0–36.0)
MCV: 90.8 fL (ref 80.0–100.0)
MPV: 11.1 fL (ref 7.5–12.5)
Monocytes Relative: 8 %
NEUTROS ABS: 5115 {cells}/uL (ref 1500–7800)
Neutrophils Relative %: 68.2 %
PLATELETS: 242 10*3/uL (ref 140–400)
RBC: 3.37 10*6/uL — ABNORMAL LOW (ref 3.80–5.10)
RDW: 13 % (ref 11.0–15.0)
RPR Ser Ql: REACTIVE — AB
Rubella: 12.8 index
TOTAL LYMPHOCYTE: 23 %
WBC: 7.5 10*3/uL (ref 3.8–10.8)
WBCMIX: 600 {cells}/uL (ref 200–950)

## 2017-10-10 LAB — RPR TITER

## 2017-10-10 LAB — FLUORESCENT TREPONEMAL AB(FTA)-IGG-BLD: Fluorescent Treponemal ABS: NONREACTIVE

## 2017-10-10 LAB — CERVICOVAGINAL ANCILLARY ONLY
Chlamydia: NEGATIVE
Neisseria Gonorrhea: NEGATIVE

## 2017-10-10 LAB — HIV ANTIBODY (ROUTINE TESTING W REFLEX): HIV 1&2 Ab, 4th Generation: NONREACTIVE

## 2017-10-11 ENCOUNTER — Encounter: Payer: Self-pay | Admitting: Obstetrics & Gynecology

## 2017-10-11 DIAGNOSIS — R768 Other specified abnormal immunological findings in serum: Secondary | ICD-10-CM | POA: Insufficient documentation

## 2017-10-11 LAB — CULTURE, OB URINE

## 2017-10-11 LAB — URINE CULTURE, OB REFLEX

## 2017-10-11 NOTE — Progress Notes (Signed)
  Subjective:    Katie Love is a [id number] 3712w1d being seen today for her first obstetrical visit.  Her obstetrical history is significant for late prenatal care, history of c/s x2, anemia. Patient does intend to breast feed. Pregnancy history fully reviewed.  Patient reports no complaints.  Vitals:   10/09/17 1042  BP: 112/72  Pulse: 75  Weight: 175 lb (79.4 kg)    HISTORY: OB History  Gravida Para Term Preterm AB Living  3 2 2     2   SAB TAB Ectopic Multiple Live Births          2    # Outcome Date GA Lbr Len/2nd Weight Sex Delivery Anes PTL Lv  3 Current           2 Term 01/02/13 5856w1d  7 lb 4.6 oz (3.305 kg) M CS-LTranv Spinal  LIV  1 Term     M CS-LTranv   LIV     Past Medical History:  Diagnosis Date  . Medical history non-contributory   . No pertinent past medical history    Past Surgical History:  Procedure Laterality Date  . CESAREAN SECTION  2008  . CESAREAN SECTION N/A 01/02/2013   Procedure: CESAREAN SECTION;  Surgeon: Lesly DukesKelly H Janiylah Hannis, MD;  Location: [facility];  Service: Obstetrics;  Laterality: N/A;   History reviewed. No pertinent family history.   Exam    Uterus:     Pelvic Exam:    Perineum: Not performed   Vulva: Not performed   Vagina:  Not performed   pH: Not performed   Cervix: Not performed   Adnexa: Not performed   Bony Pelvis: Not performed  System: Breast:  negative   Skin: normal coloration and turgor, no rashes    Neurologic: oriented, normal mood   Extremities: no deformities   HEENT sclera clear, anicteric   Mouth/Teeth mucous membranes moist, pharynx normal without lesions   Neck supple   Cardiovascular: Reg rate   Respiratory:  appears well, vitals normal, no respiratory distress, acyanotic, normal RR   Abdomen: gravid, non tender   Urinary: not performed      Assessment:    Pregnancy: [id number] Patient Active Problem List   Diagnosis Date Noted  . Biological false positive RPR test 10/11/2017  . Supervision of other  normal pregnancy, antepartum 10/09/2017  . Recurrent vaginitis 03/25/2012        Plan:     Initial labs drawn. Prenatal vitamins. Problem list reviewed and updated. Genetic Screening discussed Quad Screen: ordered.  Ultrasound discussed; fetal survey: ordered.  Follow up in 4 weeks.  Pt had lapse in insurance which caused late entry.  Anemia-add ferritin, B12 level False + RPR   Katie LincolnKelly Mariyanna Mucha 10/11/2017

## 2017-10-15 ENCOUNTER — Ambulatory Visit (HOSPITAL_COMMUNITY)
Admission: RE | Admit: 2017-10-15 | Discharge: 2017-10-15 | Disposition: A | Discharge status: Home / Self Care | Payer: BLUE CROSS/BLUE SHIELD | Source: Ambulatory Visit | Attending: Obstetrics & Gynecology | Admitting: Obstetrics & Gynecology

## 2017-10-15 ENCOUNTER — Other Ambulatory Visit: Payer: Self-pay | Admitting: Obstetrics & Gynecology

## 2017-10-15 DIAGNOSIS — O34219 Maternal care for unspecified type scar from previous cesarean delivery: Secondary | ICD-10-CM | POA: Diagnosis not present

## 2017-10-15 DIAGNOSIS — Z363 Encounter for antenatal screening for malformations: Secondary | ICD-10-CM | POA: Diagnosis present

## 2017-10-15 DIAGNOSIS — Z369 Encounter for antenatal screening, unspecified: Secondary | ICD-10-CM

## 2017-10-15 DIAGNOSIS — Z3A19 19 weeks gestation of pregnancy: Secondary | ICD-10-CM

## 2017-10-15 DIAGNOSIS — Z3480 Encounter for supervision of other normal pregnancy, unspecified trimester: Secondary | ICD-10-CM

## 2017-10-24 ENCOUNTER — Other Ambulatory Visit: Payer: Self-pay | Admitting: *Deleted

## 2017-10-24 DIAGNOSIS — Z3480 Encounter for supervision of other normal pregnancy, unspecified trimester: Secondary | ICD-10-CM

## 2017-11-11 ENCOUNTER — Ambulatory Visit (INDEPENDENT_AMBULATORY_CARE_PROVIDER_SITE_OTHER): Payer: BLUE CROSS/BLUE SHIELD | Admitting: Obstetrics & Gynecology

## 2017-11-11 VITALS — BP 120/75 | HR 80 | Wt 180.0 lb

## 2017-11-11 DIAGNOSIS — Z3482 Encounter for supervision of other normal pregnancy, second trimester: Secondary | ICD-10-CM | POA: Diagnosis not present

## 2017-11-11 DIAGNOSIS — Z0489 Encounter for examination and observation for other specified reasons: Secondary | ICD-10-CM

## 2017-11-11 DIAGNOSIS — IMO0002 Reserved for concepts with insufficient information to code with codable children: Secondary | ICD-10-CM

## 2017-11-11 DIAGNOSIS — Z3480 Encounter for supervision of other normal pregnancy, unspecified trimester: Secondary | ICD-10-CM

## 2017-11-11 NOTE — Progress Notes (Signed)
   PRENATAL VISIT NOTE  Subjective:  Katie Love is a 32 y.o. G3P2002 at 981w4d being seen today for ongoing prenatal care.  She is currently monitored for the following issues for this low-risk pregnancy and has Recurrent vaginitis; Supervision of other normal pregnancy, antepartum; and Biological false positive RPR test on their problem list.  Patient reports no complaints.  Contractions: Not present. Vag. Bleeding: None.  Movement: Present. Denies leaking of fluid.   The following portions of the patient's history were reviewed and updated as appropriate: allergies, current medications, past family history, past medical history, past social history, past surgical history and problem list. Problem list updated.  Objective:   Vitals:   11/11/17 1341  BP: 120/75  Pulse: 80  Weight: 180 lb (81.6 kg)    Fetal Status: Fetal Heart Rate (bpm): 149 Fundal Height: 23 cm Movement: Present     General:  Alert, oriented and cooperative. Patient is in no acute distress.  Skin: Skin is warm and dry. No rash noted.   Cardiovascular: Normal heart rate noted  Respiratory: Normal respiratory effort, no problems with respiration noted  Abdomen: Soft, gravid, appropriate for gestational age.  Pain/Pressure: Absent     Pelvic: Cervical exam deferred        Extremities: Normal range of motion.  Edema: None  Mental Status:  Normal mood and affect. Normal behavior. Normal judgment and thought content.   Assessment and Plan:  Pregnancy: G3P2002 at 481w4d  1. Supervision of other normal pregnancy, antepartum - Needs f/u US for anatomy. - Ferritin - B12  Preterm labor symptoms and general obstetric precautions including but not limited to vaginal bleeding, contractions, leaking of fluid and fetal movement were reviewed in detail with the patient. Please refer to After Visit Summary for other counseling recommendations.   RTC 4 weeks.  Elsie LincolnKelly Caedence Snowden, MD

## 2017-11-12 LAB — VITAMIN B12: Vitamin B-12: 660 pg/mL (ref 200–1100)

## 2017-11-12 LAB — FERRITIN: FERRITIN: 50 ng/mL (ref 10–154)

## 2017-11-15 ENCOUNTER — Telehealth: Payer: Self-pay | Admitting: *Deleted

## 2017-11-15 NOTE — Telephone Encounter (Signed)
LM on voicemail of normal labs 

## 2017-11-15 NOTE — Telephone Encounter (Signed)
-----   Message from Lesly DukesKelly H Leggett, MD sent at 11/14/2017  8:31 PM EST ----- nml ferritin and B12  RN to call--no my chart.

## 2017-11-19 ENCOUNTER — Ambulatory Visit (HOSPITAL_COMMUNITY): Payer: BLUE CROSS/BLUE SHIELD

## 2017-12-13 ENCOUNTER — Ambulatory Visit (INDEPENDENT_AMBULATORY_CARE_PROVIDER_SITE_OTHER): Payer: BLUE CROSS/BLUE SHIELD | Admitting: Certified Nurse Midwife

## 2017-12-13 VITALS — BP 110/61 | HR 85 | Wt 183.0 lb

## 2017-12-13 DIAGNOSIS — Z3480 Encounter for supervision of other normal pregnancy, unspecified trimester: Secondary | ICD-10-CM

## 2017-12-13 DIAGNOSIS — O34219 Maternal care for unspecified type scar from previous cesarean delivery: Secondary | ICD-10-CM | POA: Insufficient documentation

## 2017-12-13 NOTE — Patient Instructions (Signed)

## 2017-12-13 NOTE — Progress Notes (Signed)
Subjective:  Katie Love is a 32 y.o. G3P2002 at 2270w1d being seen today for ongoing prenatal care.  She is currently monitored for the following issues for this low-risk pregnancy and has Recurrent vaginitis; Supervision of other normal pregnancy, antepartum; Biological false positive RPR test; and History of cesarean delivery, currently pregnant on their problem list.  Patient reports no complaints.  Contractions: Not present. Vag. Bleeding: None.  Movement: Present. Denies leaking of fluid.   The following portions of the patient's history were reviewed and updated as appropriate: allergies, current medications, past family history, past medical history, past social history, past surgical history and problem list. Problem list updated.  Objective:   Vitals:   12/13/17 0924  BP: 110/61  Pulse: 85  Weight: 183 lb (83 kg)    Fetal Status: Fetal Heart Rate (bpm): 136 Fundal Height: 29 cm Movement: Present  Presentation: Vertex  General:  Alert, oriented and cooperative. Patient is in no acute distress.  Skin: Skin is warm and dry. No rash noted.   Cardiovascular: Normal heart rate noted  Respiratory: Normal respiratory effort, no problems with respiration noted  Abdomen: Soft, gravid, appropriate for gestational age. Pain/Pressure: Absent     Pelvic: Vag. Bleeding: None Vag D/C Character: Thin   Cervical exam deferred        Extremities: Normal range of motion.  Edema: None  Mental Status: Normal mood and affect. Normal behavior. Normal judgment and thought content.   Urinalysis: Urine Protein: Negative Urine Glucose: Negative  Assessment and Plan:  Pregnancy: G3P2002 at 2470w1d  1. Supervision of other normal pregnancy, antepartum - 2Hr GTT w/ 1 Hr Carpenter 75 g  > not fasting today, needs to come back for test - CBC - HIV antibody (with reflex) - RPR - anatomy US incomplete >pt declines f/u US "I'm not gonna do it, my baby is fine"  2. History of cesarean delivery,  currently pregnant - rpt at 39 wks  Preterm labor symptoms and general obstetric precautions including but not limited to vaginal bleeding, contractions, leaking of fluid and fetal movement were reviewed in detail with the patient. Please refer to After Visit Summary for other counseling recommendations.  Return in about 2 weeks (around 12/27/2017).   Donette LarryBhambri, Norina Cowper, CNM

## 2017-12-30 ENCOUNTER — Ambulatory Visit (INDEPENDENT_AMBULATORY_CARE_PROVIDER_SITE_OTHER): Payer: BLUE CROSS/BLUE SHIELD | Admitting: Obstetrics & Gynecology

## 2017-12-30 DIAGNOSIS — Z3480 Encounter for supervision of other normal pregnancy, unspecified trimester: Secondary | ICD-10-CM

## 2017-12-30 DIAGNOSIS — Z3483 Encounter for supervision of other normal pregnancy, third trimester: Secondary | ICD-10-CM

## 2017-12-30 NOTE — Progress Notes (Signed)
   PRENATAL VISIT NOTE  Subjective:  Katie Love is a 32 y.o. G3P2002 at 478w4d being seen today for ongoing prenatal care.  She is currently monitored for the following issues for this low-risk pregnancy and has Recurrent vaginitis; Supervision of other normal pregnancy, antepartum; Biological false positive RPR test; and History of cesarean delivery, currently pregnant on their problem list.  Patient reports no complaints.  Contractions: Not present. Vag. Bleeding: None.  Movement: Present. Denies leaking of fluid.   The following portions of the patient's history were reviewed and updated as appropriate: allergies, current medications, past family history, past medical history, past social history, past surgical history and problem list. Problem list updated.  Objective:   Vitals:   12/30/17 1614  BP: 129/80  Pulse: 78  Weight: 186 lb (84.4 kg)    Fetal Status: Fetal Heart Rate (bpm): 138 Fundal Height: 33 cm Movement: Present     General:  Alert, oriented and cooperative. Patient is in no acute distress.  Skin: Skin is warm and dry. No rash noted.   Cardiovascular: Normal heart rate noted  Respiratory: Normal respiratory effort, no problems with respiration noted  Abdomen: Soft, gravid, appropriate for gestational age.  Pain/Pressure: Absent     Pelvic: Cervical exam deferred        Extremities: Normal range of motion.  Edema: None  Mental Status:  Normal mood and affect. Normal behavior. Normal judgment and thought content.   Assessment and Plan:  Pregnancy: G3P2002 at 7678w4d  1. Supervision of other normal pregnancy, antepartum -cbg after OJ about 30 minutes ago (116) - Pt needs to do 2 hour GTT and 28 weeks labs.  2.  C/s section and BTL scheduled.  Preterm labor symptoms and general obstetric precautions including but not limited to vaginal bleeding, contractions, leaking of fluid and fetal movement were reviewed in detail with the patient. Please refer to After  Visit Summary for other counseling recommendations.  Return in about 2 weeks (around 01/13/2018).   Elsie LincolnKelly Abbygail Willhoite, MD

## 2017-12-31 ENCOUNTER — Encounter (HOSPITAL_COMMUNITY): Payer: Self-pay

## 2018-01-08 ENCOUNTER — Telehealth: Payer: Self-pay | Admitting: Obstetrics & Gynecology

## 2018-01-08 NOTE — Telephone Encounter (Addendum)
Pt canceled 01/13/18 2 WK OB APPT. Pt refuses to reschedule with any other provider than Leggett. The next appt would put her past her 2 week rob visit timeline to 01/28/18 which pt wants only specific times as well. Please advise.

## 2018-01-09 NOTE — Telephone Encounter (Signed)
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  I spoke with Dr Penne LashLeggett and OK to double book for pt to see her.  Pt made aware and is satisfied with that date and time.

## 2018-01-13 ENCOUNTER — Encounter: Payer: BLUE CROSS/BLUE SHIELD | Admitting: Obstetrics & Gynecology

## 2018-01-22 ENCOUNTER — Encounter: Payer: Self-pay | Admitting: Obstetrics & Gynecology

## 2018-01-22 ENCOUNTER — Ambulatory Visit (INDEPENDENT_AMBULATORY_CARE_PROVIDER_SITE_OTHER): Payer: BLUE CROSS/BLUE SHIELD | Admitting: Obstetrics & Gynecology

## 2018-01-22 VITALS — BP 102/65 | HR 80 | Wt 189.0 lb

## 2018-01-22 DIAGNOSIS — Z3483 Encounter for supervision of other normal pregnancy, third trimester: Secondary | ICD-10-CM

## 2018-01-22 DIAGNOSIS — Z3480 Encounter for supervision of other normal pregnancy, unspecified trimester: Secondary | ICD-10-CM

## 2018-01-22 NOTE — Progress Notes (Signed)
Bilateral carpal tunnel    PRENATAL VISIT NOTE  Subjective:  Shella Maximnnocentia Elgin is a 32 y.o. G3P2002 at 4852w6d being seen today for ongoing prenatal care.  She is currently monitored for the following issues for this low-risk pregnancy and has Recurrent vaginitis; Supervision of other normal pregnancy, antepartum; Biological false positive RPR test; and History of cesarean delivery, currently pregnant on their problem list.  Patient reports hands swelling, difficulty sleeping..  Contractions: Irritability. Vag. Bleeding: None.  Movement: Present. Denies leaking of fluid.   The following portions of the patient's history were reviewed and updated as appropriate: allergies, current medications, past family history, past medical history, past social history, past surgical history and problem list. Problem list updated.  Objective:   Vitals:   01/22/18 1018  BP: 102/65  Pulse: 80  Weight: 189 lb (85.7 kg)    Fetal Status: Fetal Heart Rate (bpm): 131 Fundal Height: 38 cm Movement: Present     General:  Alert, oriented and cooperative. Patient is in no acute distress.  Skin: Skin is warm and dry. No rash noted.   Cardiovascular: Normal heart rate noted  Respiratory: Normal respiratory effort, no problems with respiration noted  Abdomen: Soft, gravid, appropriate for gestational age.  Pain/Pressure: Present     Pelvic: Cervical exam deferred        Extremities: Normal range of motion.  Edema: Trace  Mental Status:  Normal mood and affect. Normal behavior. Normal judgment and thought content.   Assessment and Plan:  Pregnancy: G3P2002 at 4652w6d  1.  Noncompliance with GDM testing--do one hour today. 2.  S>D--growth US 3.  Night splints for carpel tunel 4.  28 week labs; refuses tdap  Preterm labor symptoms and general obstetric precautions including but not limited to vaginal bleeding, contractions, leaking of fluid and fetal movement were reviewed in detail with the patient. Please  refer to After Visit Summary for other counseling recommendations.   RTC 2 weeks Check BP at work (increased hand swelling--BP is nml)  Elsie LincolnKelly Leggett, MD

## 2018-01-23 ENCOUNTER — Telehealth: Payer: Self-pay | Admitting: *Deleted

## 2018-01-23 LAB — COMPREHENSIVE METABOLIC PANEL
AG Ratio: 1.2 (calc) (ref 1.0–2.5)
ALT: 10 U/L (ref 6–29)
AST: 15 U/L (ref 10–30)
Albumin: 3.3 g/dL — ABNORMAL LOW (ref 3.6–5.1)
Alkaline phosphatase (APISO): 132 U/L — ABNORMAL HIGH (ref 33–115)
BILIRUBIN TOTAL: 0.5 mg/dL (ref 0.2–1.2)
BUN / CREAT RATIO: 7 (calc) (ref 6–22)
BUN: 4 mg/dL — ABNORMAL LOW (ref 7–25)
CALCIUM: 8.9 mg/dL (ref 8.6–10.2)
CHLORIDE: 106 mmol/L (ref 98–110)
CO2: 26 mmol/L (ref 20–32)
Creat: 0.57 mg/dL (ref 0.50–1.10)
GLOBULIN: 2.7 g/dL (ref 1.9–3.7)
GLUCOSE: 139 mg/dL — AB (ref 65–99)
Potassium: 3.6 mmol/L (ref 3.5–5.3)
SODIUM: 133 mmol/L — AB (ref 135–146)
TOTAL PROTEIN: 6 g/dL — AB (ref 6.1–8.1)

## 2018-01-23 LAB — GLUCOSE TOLERANCE, 1 HOUR (50G) W/O FASTING: GLUCOSE, 1 HR, GESTATIONAL: 139 mg/dL (ref 65–139)

## 2018-01-23 LAB — CBC
HCT: 31.4 % — ABNORMAL LOW (ref 35.0–45.0)
Hemoglobin: 10.7 g/dL — ABNORMAL LOW (ref 11.7–15.5)
MCH: 30.1 pg (ref 27.0–33.0)
MCHC: 34.1 g/dL (ref 32.0–36.0)
MCV: 88.5 fL (ref 80.0–100.0)
MPV: 11.1 fL (ref 7.5–12.5)
Platelets: 255 10*3/uL (ref 140–400)
RBC: 3.55 10*6/uL — ABNORMAL LOW (ref 3.80–5.10)
RDW: 13.2 % (ref 11.0–15.0)
WBC: 5.3 10*3/uL (ref 3.8–10.8)

## 2018-01-23 LAB — HIV ANTIBODY (ROUTINE TESTING W REFLEX): HIV 1&2 Ab, 4th Generation: NONREACTIVE

## 2018-01-23 LAB — RPR: RPR: NONREACTIVE

## 2018-01-23 NOTE — Telephone Encounter (Signed)
Pt notified of abnormal 1 hr GTT.  I told patient we would send a referral for Nutrition and Diabetic teaching and send in RX for Glucometer meter strips and lancets.  Pt states, " I am not going to do all that, I don't have time for it"  "I drank sweet tea yesterday that's why my glucose was high.  She is scheduled for a f/[facility].  Will let Dr Penne LashLeggett aware of what pt says.

## 2018-01-29 ENCOUNTER — Ambulatory Visit (HOSPITAL_COMMUNITY)
Admission: RE | Admit: 2018-01-29 | Discharge: 2018-01-29 | Disposition: A | Discharge status: Home / Self Care | Payer: BLUE CROSS/BLUE SHIELD | Source: Ambulatory Visit | Attending: Obstetrics & Gynecology | Admitting: Obstetrics & Gynecology

## 2018-01-29 DIAGNOSIS — O34219 Maternal care for unspecified type scar from previous cesarean delivery: Secondary | ICD-10-CM | POA: Insufficient documentation

## 2018-01-29 DIAGNOSIS — Z362 Encounter for other antenatal screening follow-up: Secondary | ICD-10-CM | POA: Insufficient documentation

## 2018-01-29 DIAGNOSIS — Z3A34 34 weeks gestation of pregnancy: Secondary | ICD-10-CM | POA: Insufficient documentation

## 2018-01-29 DIAGNOSIS — Z3480 Encounter for supervision of other normal pregnancy, unspecified trimester: Secondary | ICD-10-CM

## 2018-01-31 ENCOUNTER — Encounter: Payer: Self-pay | Admitting: Obstetrics & Gynecology

## 2018-01-31 DIAGNOSIS — O3660X0 Maternal care for excessive fetal growth, unspecified trimester, not applicable or unspecified: Secondary | ICD-10-CM | POA: Insufficient documentation

## 2018-02-12 ENCOUNTER — Encounter (HOSPITAL_COMMUNITY): Payer: Self-pay

## 2018-02-13 ENCOUNTER — Ambulatory Visit (INDEPENDENT_AMBULATORY_CARE_PROVIDER_SITE_OTHER): Payer: BLUE CROSS/BLUE SHIELD | Admitting: Obstetrics & Gynecology

## 2018-02-13 VITALS — BP 125/81 | HR 82 | Wt 190.0 lb

## 2018-02-13 DIAGNOSIS — Z3480 Encounter for supervision of other normal pregnancy, unspecified trimester: Secondary | ICD-10-CM

## 2018-02-13 DIAGNOSIS — O24419 Gestational diabetes mellitus in pregnancy, unspecified control: Secondary | ICD-10-CM

## 2018-02-13 LAB — GLUCOSE, POCT (MANUAL RESULT ENTRY): POC GLUCOSE: 85 mg/dL (ref 70–99)

## 2018-02-13 NOTE — Progress Notes (Signed)
cbg-85    PRENATAL VISIT NOTE  Subjective:  Katie Love is a 32 y.o. G3P2002 at 65104w1d being seen today for ongoing prenatal care.  She is currently monitored for the following issues for this high-risk pregnancy and has Recurrent vaginitis; Supervision of other normal pregnancy, antepartum; Biological false positive RPR test; History of cesarean delivery, currently pregnant; and Large for gestational age fetus affecting management of mother, antepartum on their problem list.  Patient reports hand swelling and pain not relieved with night splints..  Contractions: Irritability. Vag. Bleeding: None.  Movement: Present. Denies leaking of fluid.   The following portions of the patient's history were reviewed and updated as appropriate: allergies, current medications, past family history, past medical history, past social history, past surgical history and problem list. Problem list updated.  Objective:   Vitals:   02/13/18 1539  BP: 125/81  Pulse: 82  Weight: 190 lb (86.2 kg)    Fetal Status: Fetal Heart Rate (bpm): NST-R   Movement: Present     General:  Alert, oriented and cooperative. Patient is in no acute distress.  Skin: Skin is warm and dry. No rash noted.   Cardiovascular: Normal heart rate noted  Respiratory: Normal respiratory effort, no problems with respiration noted  Abdomen: Soft, gravid, appropriate for gestational age.  Pain/Pressure: Present     Pelvic: Cervical exam deferred        Extremities: Normal range of motion.  Edema: Mild pitting, slight indentation  Mental Status: Normal mood and affect. Normal behavior. Normal judgment and thought content.   Assessment and Plan:  Pregnancy: G3P2002 at 43104w1d  1. Gestational diabetes mellitus (GDM) in third trimester, gestational diabetes method of control unspecified -pt did not complete 3 hour - POCT Glucose (CBG) (about 2-3 hours after eating oranges)  2. Supervision of other normal pregnancy, antepartum Planned  rpt c/s and BTL in 2 weeks.  Pt encouraged to keep appt next week; however, she may cancel.    Term labor symptoms and general obstetric precautions including but not limited to vaginal bleeding, contractions, leaking of fluid and fetal movement were reviewed in detail with the patient. Please refer to After Visit Summary for other counseling recommendations.  Return in about 1 week (around 02/20/2018).  Future Appointments  Date Time Provider Department Center  02/21/2018 10:30 AM Armando ReichertHogan, Heather D, CNM [facility]  02/26/2018  9:45 AM [facility] PAT 5 [facility] None    Elsie LincolnKelly Harrol Novello, MD

## 2018-02-21 ENCOUNTER — Encounter: Payer: BLUE CROSS/BLUE SHIELD | Admitting: Advanced Practice Midwife

## 2018-02-25 NOTE — Patient Instructions (Signed)
Katie Love  02/25/2018   Your procedure is scheduled on:  02/27/2018  Enter through the Main Entrance of [facility] at 0730 AM.  Pick up the phone at the desk and dial [phone]  Call this number if you have problems the morning of surgery:[phone]  Remember:   Do not eat food:(After Midnight) Desps de medianoche.  Do not drink clear liquids: (After Midnight) Desps de medianoche.  Take these medicines the morning of surgery with A SIP OF WATER: none   Do not wear jewelry, make-up or nail polish.  Do not wear lotions, powders, or perfumes. Do not wear deodorant.  Do not shave 48 hours prior to surgery.  Do not bring valuables to the hospital.  [facility] is not   responsible for any belongings or valuables brought to the hospital.  Contacts, dentures or bridgework may not be worn into surgery.  Leave suitcase in the car. After surgery it may be brought to your room.  For patients admitted to the hospital, checkout time is 11:00 AM the day of              discharge.    N/A   Please read over the following fact sheets that you were given:   Surgical Site Infection Prevention

## 2018-02-26 ENCOUNTER — Encounter (HOSPITAL_COMMUNITY)
Admission: RE | Admit: 2018-02-26 | Discharge: 2018-02-26 | Disposition: A | Discharge status: Home / Self Care | Payer: BLUE CROSS/BLUE SHIELD | Source: Ambulatory Visit | Attending: Obstetrics & Gynecology | Admitting: Obstetrics & Gynecology

## 2018-02-26 LAB — CBC
HCT: 30.9 % — ABNORMAL LOW (ref 36.0–46.0)
HEMOGLOBIN: 10.3 g/dL — AB (ref 12.0–15.0)
MCH: 30 pg (ref 26.0–34.0)
MCHC: 33.3 g/dL (ref 30.0–36.0)
MCV: 90.1 fL (ref 78.0–100.0)
PLATELETS: 237 10*3/uL (ref 150–400)
RBC: 3.43 MIL/uL — AB (ref 3.87–5.11)
RDW: 14.9 % (ref 11.5–15.5)
WBC: 4.7 10*3/uL (ref 4.0–10.5)

## 2018-02-26 LAB — TYPE AND SCREEN
ABO/RH(D): AB POS
Antibody Screen: NEGATIVE

## 2018-02-26 NOTE — Anesthesia Preprocedure Evaluation (Addendum)
Anesthesia Evaluation  Patient identified by MRN, date of birth, ID band Patient awake    Reviewed: Allergy & Precautions, Patient's Chart, lab work & pertinent test results  Airway Mallampati: II  TM Distance: >3 FB Neck ROM: Full    Dental no notable dental hx.    Pulmonary neg pulmonary ROS,    Pulmonary exam normal breath sounds clear to auscultation       Cardiovascular Exercise Tolerance: Good negative cardio ROS Normal cardiovascular exam Rhythm:Regular Rate:Normal     Neuro/Psych negative neurological ROS  negative psych ROS   GI/Hepatic negative GI ROS, Neg liver ROS,   Endo/Other  negative endocrine ROS  Renal/GU negative Renal ROS     Musculoskeletal   Abdominal   Peds  Hematology negative hematology ROS (+)   Anesthesia Other Findings   Reproductive/Obstetrics (+) Pregnancy                            Lab Results  Component Value Date   WBC 4.7 02/26/2018   HGB 10.3 (L) 02/26/2018   HCT 30.9 (L) 02/26/2018   MCV 90.1 02/26/2018   PLT 237 02/26/2018    Anesthesia Physical Anesthesia Plan  ASA: II  Anesthesia Plan: Spinal   Post-op Pain Management:  Regional for Post-op pain   Induction:   PONV Risk Score and Plan: Treatment may vary due to age or medical condition, Ondansetron and Scopolamine patch - Pre-op  Airway Management Planned: Mask, Natural Airway and Nasal Cannula  Additional Equipment:   Intra-op Plan:   Post-operative Plan:   Informed Consent: I have reviewed the patients History and Physical, chart, labs and discussed the procedure including the risks, benefits and alternatives for the proposed anesthesia with the patient or authorized representative who has indicated his/her understanding and acceptance.     Plan Discussed with: CRNA and Anesthesiologist  Anesthesia Plan Comments:         Anesthesia Quick Evaluation

## 2018-02-27 ENCOUNTER — Inpatient Hospital Stay (HOSPITAL_COMMUNITY): Payer: BLUE CROSS/BLUE SHIELD | Admitting: Anesthesiology

## 2018-02-27 ENCOUNTER — Encounter (HOSPITAL_COMMUNITY): Payer: Self-pay | Admitting: *Deleted

## 2018-02-27 ENCOUNTER — Inpatient Hospital Stay (HOSPITAL_COMMUNITY)
Admission: AD | Admit: 2018-02-27 | Discharge: 2018-03-02 | DRG: 785 | Disposition: A | Discharge status: Home / Self Care | Payer: BLUE CROSS/BLUE SHIELD | Source: Ambulatory Visit | Attending: Obstetrics & Gynecology | Admitting: Obstetrics & Gynecology

## 2018-02-27 ENCOUNTER — Encounter (HOSPITAL_COMMUNITY)
Admission: AD | Disposition: A | Discharge status: Home / Self Care | Payer: Self-pay | Source: Ambulatory Visit | Attending: Obstetrics & Gynecology

## 2018-02-27 ENCOUNTER — Other Ambulatory Visit: Payer: Self-pay

## 2018-02-27 DIAGNOSIS — Z98891 History of uterine scar from previous surgery: Secondary | ICD-10-CM

## 2018-02-27 DIAGNOSIS — O34211 Maternal care for low transverse scar from previous cesarean delivery: Secondary | ICD-10-CM

## 2018-02-27 DIAGNOSIS — O34219 Maternal care for unspecified type scar from previous cesarean delivery: Secondary | ICD-10-CM

## 2018-02-27 DIAGNOSIS — Z302 Encounter for sterilization: Secondary | ICD-10-CM

## 2018-02-27 DIAGNOSIS — O3663X0 Maternal care for excessive fetal growth, third trimester, not applicable or unspecified: Secondary | ICD-10-CM | POA: Diagnosis present

## 2018-02-27 DIAGNOSIS — Z3A39 39 weeks gestation of pregnancy: Secondary | ICD-10-CM

## 2018-02-27 LAB — CREATININE, SERUM
Creatinine, Ser: 0.63 mg/dL (ref 0.44–1.00)
GFR calc Af Amer: >60 mL/min (ref >60)
GFR calc non Af Amer: >60 mL/min (ref >60)

## 2018-02-27 LAB — CBC
HCT: 34.6 % — ABNORMAL LOW (ref 36.0–46.0)
HEMOGLOBIN: 11.6 g/dL — AB (ref 12.0–15.0)
MCH: 30.1 pg (ref 26.0–34.0)
MCHC: 33.5 g/dL (ref 30.0–36.0)
MCV: 89.9 fL (ref 78.0–100.0)
Platelets: 245 10*3/uL (ref 150–400)
RBC: 3.85 MIL/uL — ABNORMAL LOW (ref 3.87–5.11)
RDW: 14.4 % (ref 11.5–15.5)
WBC: 14.4 10*3/uL — ABNORMAL HIGH (ref 4.0–10.5)

## 2018-02-27 LAB — RPR: RPR: NONREACTIVE

## 2018-02-27 SURGERY — Surgical Case
Anesthesia: Spinal | Site: Abdomen | Wound class: Clean Contaminated

## 2018-02-27 MED ORDER — MENTHOL 3 MG MT LOZG: 1.0000 | LOZENGE | OROMUCOSAL | Status: DC | PRN

## 2018-02-27 MED ORDER — OXYTOCIN 10 UNIT/ML IJ SOLN
INTRAMUSCULAR | Status: AC
  Filled 2018-02-27: qty 4

## 2018-02-27 MED ORDER — SIMETHICONE 80 MG PO CHEW
80.0000 mg | CHEWABLE_TABLET | Freq: Three times a day (TID) | ORAL | Status: DC
Start: 2018-02-27 — End: 2018-03-02
  Administered 2018-02-28 – 2018-03-02 (×6): 80 mg via ORAL
  Filled 2018-02-27 (×4): qty 1

## 2018-02-27 MED ORDER — PHENYLEPHRINE 40 MCG/ML (10ML) SYRINGE FOR IV PUSH (FOR BLOOD PRESSURE SUPPORT)
PREFILLED_SYRINGE | INTRAVENOUS | Status: AC
  Filled 2018-02-27: qty 10

## 2018-02-27 MED ORDER — METHYLERGONOVINE MALEATE 0.2 MG/ML IJ SOLN
INTRAMUSCULAR | Status: AC
  Filled 2018-02-27: qty 1

## 2018-02-27 MED ORDER — SENNOSIDES-DOCUSATE SODIUM 8.6-50 MG PO TABS
2.0000 | ORAL_TABLET | ORAL | Status: DC
  Administered 2018-02-28 – 2018-03-01 (×3): 2 via ORAL
  Filled 2018-02-27 (×3): qty 2

## 2018-02-27 MED ORDER — BUPIVACAINE IN DEXTROSE 0.75-8.25 % IT SOLN
INTRATHECAL | Status: DC | PRN
  Administered 2018-02-27: 1.7 mL via INTRATHECAL

## 2018-02-27 MED ORDER — DEXAMETHASONE SODIUM PHOSPHATE 10 MG/ML IJ SOLN
INTRAMUSCULAR | Status: AC
  Filled 2018-02-27: qty 1

## 2018-02-27 MED ORDER — ONDANSETRON HCL 4 MG/2ML IJ SOLN
INTRAMUSCULAR | Status: AC
  Filled 2018-02-27: qty 2

## 2018-02-27 MED ORDER — NALBUPHINE HCL 10 MG/ML IJ SOLN: 5.0000 mg | INTRAMUSCULAR | Status: DC | PRN

## 2018-02-27 MED ORDER — TRANEXAMIC ACID 1000 MG/10ML IV SOLN
1000.0000 mg | INTRAVENOUS | Status: AC
  Administered 2018-02-27: 1000 mg via INTRAVENOUS
  Filled 2018-02-27: qty 10

## 2018-02-27 MED ORDER — TETANUS-DIPHTH-ACELL PERTUSSIS 5-2.5-18.5 LF-MCG/0.5 IM SUSP
0.5000 mL | Freq: Once | INTRAMUSCULAR | Status: AC
  Administered 2018-02-28: 0.5 mL via INTRAMUSCULAR

## 2018-02-27 MED ORDER — ONDANSETRON HCL 4 MG/2ML IJ SOLN
INTRAMUSCULAR | Status: DC | PRN
  Administered 2018-02-27: 4 mg via INTRAVENOUS

## 2018-02-27 MED ORDER — NALBUPHINE HCL 10 MG/ML IJ SOLN: 5.0000 mg | Freq: Once | INTRAMUSCULAR | Status: DC | PRN

## 2018-02-27 MED ORDER — NALBUPHINE HCL 10 MG/ML IJ SOLN
5.0000 mg | INTRAMUSCULAR | Status: DC | PRN
  Administered 2018-02-28: 5 mg via INTRAVENOUS
  Filled 2018-02-27 (×2): qty 1

## 2018-02-27 MED ORDER — ZOLPIDEM TARTRATE 5 MG PO TABS: 5.0000 mg | ORAL_TABLET | Freq: Every evening | ORAL | Status: DC | PRN

## 2018-02-27 MED ORDER — MEPERIDINE HCL 25 MG/ML IJ SOLN: 6.2500 mg | INTRAMUSCULAR | Status: DC | PRN

## 2018-02-27 MED ORDER — COCONUT OIL OIL
1.0000 "application " | TOPICAL_OIL | Status: DC | PRN
  Filled 2018-02-27: qty 120

## 2018-02-27 MED ORDER — DIPHENHYDRAMINE HCL 25 MG PO CAPS
25.0000 mg | ORAL_CAPSULE | ORAL | Status: DC | PRN
  Administered 2018-02-28: 25 mg via ORAL
  Filled 2018-02-27: qty 1

## 2018-02-27 MED ORDER — ONDANSETRON HCL 4 MG/2ML IJ SOLN: 4.0000 mg | Freq: Three times a day (TID) | INTRAMUSCULAR | Status: DC | PRN

## 2018-02-27 MED ORDER — OXYCODONE HCL 5 MG PO TABS
10.0000 mg | ORAL_TABLET | ORAL | Status: DC | PRN
  Administered 2018-03-01 – 2018-03-02 (×3): 10 mg via ORAL
  Filled 2018-02-27 (×2): qty 2

## 2018-02-27 MED ORDER — SIMETHICONE 80 MG PO CHEW
80.0000 mg | CHEWABLE_TABLET | ORAL | Status: DC
  Administered 2018-02-28 – 2018-03-01 (×3): 80 mg via ORAL
  Filled 2018-02-27 (×3): qty 1

## 2018-02-27 MED ORDER — NALOXONE HCL 0.4 MG/ML IJ SOLN
0.4000 mg | INTRAMUSCULAR | Status: DC | PRN
Start: 2018-02-27 — End: 2018-03-02

## 2018-02-27 MED ORDER — PHENYLEPHRINE 8 MG IN D5W 100 ML (0.08MG/ML) PREMIX OPTIME
INJECTION | INTRAVENOUS | Status: AC
  Filled 2018-02-27: qty 100

## 2018-02-27 MED ORDER — PHENYLEPHRINE HCL 10 MG/ML IJ SOLN
INTRAMUSCULAR | Status: DC | PRN
  Administered 2018-02-27 (×6): 80 ug via INTRAVENOUS
  Administered 2018-02-27: 40 ug via INTRAVENOUS

## 2018-02-27 MED ORDER — SOD CITRATE-CITRIC ACID 500-334 MG/5ML PO SOLN
30.0000 mL | Freq: Once | ORAL | Status: AC
  Administered 2018-02-27: 30 mL via ORAL

## 2018-02-27 MED ORDER — HYDROMORPHONE HCL 1 MG/ML IJ SOLN: 0.2500 mg | INTRAMUSCULAR | Status: DC | PRN

## 2018-02-27 MED ORDER — ONDANSETRON HCL 4 MG/2ML IJ SOLN
4.0000 mg | Freq: Three times a day (TID) | INTRAMUSCULAR | Status: DC | PRN
Start: 2018-02-27 — End: 2018-03-02
  Administered 2018-02-27: 4 mg via INTRAVENOUS
  Filled 2018-02-27: qty 2

## 2018-02-27 MED ORDER — NALOXONE HCL 4 MG/10ML IJ SOLN: 1.0000 ug/kg/h | INTRAVENOUS | Status: DC | PRN

## 2018-02-27 MED ORDER — SODIUM CHLORIDE 0.9 % IV SOLN: INTRAVENOUS | Status: DC

## 2018-02-27 MED ORDER — METOCLOPRAMIDE HCL 5 MG/ML IJ SOLN
INTRAMUSCULAR | Status: DC | PRN
  Administered 2018-02-27: 10 mg via INTRAVENOUS

## 2018-02-27 MED ORDER — ACETAMINOPHEN 325 MG PO TABS
650.0000 mg | ORAL_TABLET | ORAL | Status: DC | PRN
  Administered 2018-02-28 – 2018-03-02 (×6): 650 mg via ORAL
  Filled 2018-02-27 (×6): qty 2

## 2018-02-27 MED ORDER — KETOROLAC TROMETHAMINE 30 MG/ML IJ SOLN: 30.0000 mg | Freq: Four times a day (QID) | INTRAMUSCULAR | Status: DC | PRN

## 2018-02-27 MED ORDER — OXYCODONE HCL 5 MG PO TABS
5.0000 mg | ORAL_TABLET | ORAL | Status: DC | PRN
  Administered 2018-02-28 – 2018-03-01 (×4): 5 mg via ORAL
  Filled 2018-02-27 (×6): qty 1

## 2018-02-27 MED ORDER — SIMETHICONE 80 MG PO CHEW: 80.0000 mg | CHEWABLE_TABLET | ORAL | Status: DC | PRN

## 2018-02-27 MED ORDER — SODIUM CHLORIDE 0.9% FLUSH: 3.0000 mL | INTRAVENOUS | Status: DC | PRN

## 2018-02-27 MED ORDER — PHENYLEPHRINE 8 MG IN D5W 100 ML (0.08MG/ML) PREMIX OPTIME
INJECTION | INTRAVENOUS | Status: DC | PRN
  Administered 2018-02-27: 60 ug/min via INTRAVENOUS

## 2018-02-27 MED ORDER — MORPHINE SULFATE (PF) 0.5 MG/ML IJ SOLN
INTRAMUSCULAR | Status: AC
  Filled 2018-02-27: qty 10

## 2018-02-27 MED ORDER — DIBUCAINE 1 % RE OINT: 1.0000 "application " | TOPICAL_OINTMENT | RECTAL | Status: DC | PRN

## 2018-02-27 MED ORDER — LACTATED RINGERS IV SOLN
INTRAVENOUS | Status: DC | PRN
  Administered 2018-02-27: 09:00:00 via INTRAVENOUS

## 2018-02-27 MED ORDER — SCOPOLAMINE 1 MG/3DAYS TD PT72
MEDICATED_PATCH | TRANSDERMAL | Status: AC
  Filled 2018-02-27: qty 1

## 2018-02-27 MED ORDER — DIPHENHYDRAMINE HCL 25 MG PO CAPS: 25.0000 mg | ORAL_CAPSULE | Freq: Four times a day (QID) | ORAL | Status: DC | PRN

## 2018-02-27 MED ORDER — KETOROLAC TROMETHAMINE 30 MG/ML IJ SOLN
INTRAMUSCULAR | Status: AC
  Filled 2018-02-27: qty 1

## 2018-02-27 MED ORDER — DEXAMETHASONE SODIUM PHOSPHATE 10 MG/ML IJ SOLN
INTRAMUSCULAR | Status: DC | PRN
  Administered 2018-02-27: 10 mg via INTRAVENOUS

## 2018-02-27 MED ORDER — LACTATED RINGERS IV SOLN
INTRAVENOUS | Status: DC
  Administered 2018-02-27: 125 mL/h via INTRAVENOUS
  Administered 2018-02-27: 13:00:00 via INTRAVENOUS

## 2018-02-27 MED ORDER — METHYLERGONOVINE MALEATE 0.2 MG/ML IJ SOLN
INTRAMUSCULAR | Status: DC | PRN
  Administered 2018-02-27: 0.2 mg via INTRAMUSCULAR

## 2018-02-27 MED ORDER — ACETAMINOPHEN 10 MG/ML IV SOLN: 1000.0000 mg | Freq: Once | INTRAVENOUS | Status: DC | PRN

## 2018-02-27 MED ORDER — SCOPOLAMINE 1 MG/3DAYS TD PT72
1.0000 | MEDICATED_PATCH | Freq: Once | TRANSDERMAL | Status: AC
  Administered 2018-02-27: 1.5 mg via TRANSDERMAL
  Filled 2018-02-27: qty 1

## 2018-02-27 MED ORDER — PROMETHAZINE HCL 25 MG/ML IJ SOLN: 6.2500 mg | INTRAMUSCULAR | Status: DC | PRN

## 2018-02-27 MED ORDER — CEFAZOLIN SODIUM-DEXTROSE 2-4 GM/100ML-% IV SOLN
2.0000 g | INTRAVENOUS | Status: AC
  Administered 2018-02-27: 2 g via INTRAVENOUS
  Filled 2018-02-27: qty 100

## 2018-02-27 MED ORDER — MISOPROSTOL 200 MCG PO TABS
ORAL_TABLET | ORAL | Status: AC
  Filled 2018-02-27: qty 2

## 2018-02-27 MED ORDER — ENOXAPARIN SODIUM 40 MG/0.4ML ~~LOC~~ SOLN
40.0000 mg | SUBCUTANEOUS | Status: DC
  Administered 2018-02-28 – 2018-03-01 (×2): 40 mg via SUBCUTANEOUS
  Filled 2018-02-27 (×2): qty 0.4

## 2018-02-27 MED ORDER — MORPHINE SULFATE (PF) 0.5 MG/ML IJ SOLN
INTRAMUSCULAR | Status: DC | PRN
  Administered 2018-02-27: .2 mg via INTRATHECAL

## 2018-02-27 MED ORDER — HYDROCODONE-ACETAMINOPHEN 7.5-325 MG PO TABS: 1.0000 | ORAL_TABLET | Freq: Once | ORAL | Status: DC | PRN

## 2018-02-27 MED ORDER — WITCH HAZEL-GLYCERIN EX PADS: 1.0000 "application " | MEDICATED_PAD | CUTANEOUS | Status: DC | PRN

## 2018-02-27 MED ORDER — IBUPROFEN 600 MG PO TABS
600.0000 mg | ORAL_TABLET | Freq: Four times a day (QID) | ORAL | Status: DC
Start: 2018-02-27 — End: 2018-03-02
  Administered 2018-02-28 – 2018-03-02 (×11): 600 mg via ORAL
  Filled 2018-02-27 (×10): qty 1

## 2018-02-27 MED ORDER — OXYTOCIN 10 UNIT/ML IJ SOLN
INTRAVENOUS | Status: DC | PRN
  Administered 2018-02-27: 40 [IU] via INTRAVENOUS

## 2018-02-27 MED ORDER — OXYTOCIN 40 UNITS IN LACTATED RINGERS INFUSION - SIMPLE MED: 2.5000 [IU]/h | INTRAVENOUS | Status: AC

## 2018-02-27 MED ORDER — KETOROLAC TROMETHAMINE 30 MG/ML IJ SOLN
30.0000 mg | Freq: Four times a day (QID) | INTRAMUSCULAR | Status: DC | PRN
  Administered 2018-02-27: 30 mg via INTRAMUSCULAR

## 2018-02-27 MED ORDER — PRENATAL MULTIVITAMIN CH
1.0000 | ORAL_TABLET | Freq: Every day | ORAL | Status: DC
Start: 2018-02-27 — End: 2018-03-02
  Administered 2018-03-01: 1 via ORAL
  Filled 2018-02-27 (×2): qty 1

## 2018-02-27 MED ORDER — DIPHENHYDRAMINE HCL 50 MG/ML IJ SOLN
12.5000 mg | INTRAMUSCULAR | Status: DC | PRN
  Administered 2018-02-28: 12.5 mg via INTRAVENOUS
  Filled 2018-02-27: qty 1

## 2018-02-27 SURGICAL SUPPLY — 36 items
BENZOIN TINCTURE PRP APPL 2/3 (GAUZE/BANDAGES/DRESSINGS) ×3 IMPLANT
CHLORAPREP W/TINT 26ML (MISCELLANEOUS) ×3 IMPLANT
CLAMP CORD UMBIL (MISCELLANEOUS) IMPLANT
CLIP FILSHIE TUBAL LIGA STRL (Clip) ×3 IMPLANT
CLOSURE STERI STRIP 1/2 X4 (GAUZE/BANDAGES/DRESSINGS) ×3 IMPLANT
CLOTH BEACON ORANGE TIMEOUT ST (SAFETY) ×3 IMPLANT
DRAIN JACKSON PRT FLT 7MM (DRAIN) IMPLANT
DRSG OPSITE POSTOP 4X10 (GAUZE/BANDAGES/DRESSINGS) ×3 IMPLANT
ELECT REM PT RETURN 9FT ADLT (ELECTROSURGICAL) ×3
ELECTRODE REM PT RTRN 9FT ADLT (ELECTROSURGICAL) ×1 IMPLANT
EVACUATOR SILICONE 100CC (DRAIN) IMPLANT
EXTRACTOR VACUUM M CUP 4 TUBE (SUCTIONS) IMPLANT
EXTRACTOR VACUUM M CUP 4' TUBE (SUCTIONS)
GLOVE BIO SURGEON STRL SZ7 (GLOVE) ×3 IMPLANT
GLOVE BIOGEL PI IND STRL 7.0 (GLOVE) ×2 IMPLANT
GLOVE BIOGEL PI INDICATOR 7.0 (GLOVE) ×4
GOWN STRL REUS W/TWL LRG LVL3 (GOWN DISPOSABLE) ×6 IMPLANT
HEMOSTAT ARISTA ABSORB 3G PWDR (MISCELLANEOUS) ×3 IMPLANT
KIT ABG SYR 3ML LUER SLIP (SYRINGE) IMPLANT
NEEDLE HYPO 25X5/8 SAFETYGLIDE (NEEDLE) ×3 IMPLANT
NS IRRIG 1000ML POUR BTL (IV SOLUTION) ×3 IMPLANT
PACK C SECTION WH (CUSTOM PROCEDURE TRAY) ×3 IMPLANT
PAD ABD 7.5X8 STRL (GAUZE/BANDAGES/DRESSINGS) ×6 IMPLANT
PAD ABD 8X7 1/2 STERILE (GAUZE/BANDAGES/DRESSINGS) ×6 IMPLANT
PAD OB MATERNITY 4.3X12.25 (PERSONAL CARE ITEMS) ×3 IMPLANT
PENCIL SMOKE EVAC W/HOLSTER (ELECTROSURGICAL) ×3 IMPLANT
RTRCTR C-SECT PINK 25CM LRG (MISCELLANEOUS) ×3 IMPLANT
STRIP CLOSURE SKIN 1/2X4 (GAUZE/BANDAGES/DRESSINGS) ×2 IMPLANT
SUT CHROMIC 0 CT 1 (SUTURE) ×6 IMPLANT
SUT MON AB 2-0 SH 27 (SUTURE) ×2
SUT MON AB 2-0 SH27 (SUTURE) ×1 IMPLANT
SUT VIC AB 0 CTX 36 (SUTURE) ×10
SUT VIC AB 0 CTX36XBRD ANBCTRL (SUTURE) ×5 IMPLANT
SUT VIC AB 4-0 KS 27 (SUTURE) ×3 IMPLANT
TOWEL OR 17X24 6PK STRL BLUE (TOWEL DISPOSABLE) ×3 IMPLANT
TRAY FOLEY W/BAG SLVR 14FR LF (SET/KITS/TRAYS/PACK) ×3 IMPLANT

## 2018-02-27 NOTE — Anesthesia Postprocedure Evaluation (Signed)
Anesthesia Post Note  Patient: Katie Love  Procedure(s) Performed: REPEAT CESAREAN SECTION WITH BILATERAL TUBAL LIGATION (N/A Abdomen)     Patient location during evaluation: PACU Anesthesia Type: Spinal Level of consciousness: oriented and awake and alert Pain management: pain level controlled Vital Signs Assessment: post-procedure vital signs reviewed and stable Respiratory status: spontaneous breathing, respiratory function stable and patient connected to nasal cannula oxygen Cardiovascular status: blood pressure returned to baseline and stable Postop Assessment: no headache, no backache and no apparent nausea or vomiting Anesthetic complications: no    Last Vitals:  Vitals:   02/27/18 1220 02/27/18 1230  BP:  121/70  Pulse: 63 66  Resp: 19 (!) 22  Temp: (!) 36.4 C   SpO2: 99% 98%    Last Pain:  Vitals:   02/27/18 1230  TempSrc:   PainSc: 0-No pain   Pain Goal:                 Trevor Iha

## 2018-02-27 NOTE — Op Note (Addendum)
Katie Love  PROCEDURE DATE: 02/27/2018   PREOPERATIVE DIAGNOSIS: Intrauterine pregnancy at [redacted]w[redacted]d weeks gestation, prior cesarean x2, undesired fertility   POSTOPERATIVE DIAGNOSIS: The same  PROCEDURE:  Repeat Low Transverse Cesarean Section and Bilateral Tubal Sterilization with Filshie clips  SURGEON:  Dr. Elsie Lincoln  ASSISTANT: Dr. Caryl Ada  INDICATIONS: Katie Love is a 32 y.o. [id number] at [redacted]w[redacted]d here for cesarean section and bilateral tubal sterilization secondary to the indications listed under preoperative diagnoses; please see preoperative note for further details.  The risks of surgery were discussed with the patient including but were not limited to: bleeding which may require transfusion or reoperation; infection which may require antibiotics; injury to bowel, bladder, ureters or other surrounding organs; injury to the fetus; need for additional procedures including hysterectomy in the event of a life-threatening hemorrhage; placental abnormalities wth subsequent pregnancies, incisional problems, thromboembolic phenomenon and other postoperative/anesthesia complications.  Patient also desires permanent sterilization.  Other reversible forms of contraception were discussed with patient; she declines all other modalities. Risks of procedure discussed with patient including but not limited to: risk of regret, permanence of method, bleeding, infection, injury to surrounding organs and need for additional procedures.  Failure risk of 1-2% with increased risk of ectopic gestation if pregnancy occurs was also discussed with patient.  The patient concurred with the proposed plan, giving informed written consent for the procedures.    FINDINGS:  Viable female  infant in cephalic presentation, 8, 9 Apgars, weight to be determined in 1 hour, clear amniotic fluid.  Intact placenta, three vessel cord.  Grossly normal uterus, ovaries and fallopian tubes. Placenta with ancillary lobe and  calcification. .   ANESTHESIA: Spinal  ESTIMATED BLOOD LOSS: 1008 mL  SPECIMENS: Placenta sent to pathology  COMPLICATIONS: None immediate  PROCEDURE IN DETAIL:  The patient received intravenous antibiotics and had sequential compression devices applied to her lower extremities.  Spinal anesthesia was administered and was found to be adequate. She was then placed in a dorsal supine position with a leftward tilt, and prepped and draped in a sterile manner.  A foley catheter was placed into her bladder and attached to constant gravity.  After an adequate timeout was performed, a Pfannenstiel skin incision was made with scalpel and carried through to the underlying layer of fascia. The fascia was incised in the midline and this incision was extended bilaterally using the Mayo scissors. Kocher clamps were applied to the superior aspect of the fascial incision and the underlying rectus muscles were dissected off bluntly. A similar process was carried out on the inferior aspect of the facial incision. The rectus muscles were separated in the midline sharply and Maylard incision performed bilaterally due to dense adhesive tissue. The peritoneum was entered bluntly. Alexis retractor placed. Attention was turned to the lower uterine segment where a bladder flap was made.  A low transverse hysterotomy was made with a scalpel and extended bilaterally bluntly. The infant was successfully delivered, and cord was clamped and cut and infant was handed over to awaiting neonatology team. Uterine massage was then administered and the placenta delivered intact with three-vessel cord; ancillary lobe appreciated in lower uterine segement. The uterus was cleared of clot and debris.  Poor hemostasis to posterior uterine wall after placenta delivered. A couple figure of eight chromic sutures placed over vessels for hemostasis. The hysterotomy was closed with 0 vicryl.  A second imbricating suture of 0-Vicryl was used to reinforce  the incision and aid in hemostasis.  Arista used for  additional hemostasis. Attention was then turned to the left fallopian tube which was identified and followed out to the fimbriated end.  A Filshie clip was placed on the left fallopian tube about 3 cm from the cornual attachment, with care given to incorporate the underlying mesosalpinx.  A similar process was carried out on the right side allowing for bilateral tubal sterilization.  Good hemostasis was noted overall. The peritoneum and rectus muscles were noted to be hemostatic.  The peritoneum was closed using a running stitch of 2-0 Monocryl. The fascia was closed with 0-Vicryl in a running fashion with good restoration of anatomy.  The subcutaneus tissue was copiously irrigated.  The skin was closed with 4-0 Vicryl in a subcuticular fashion.    Pt tolerated the procedure will.  All counts were correct x2.  Pt went to the recovery room in stable condition.  An experienced assistant was required given the standard of surgical care given the complexity of the case.  This assistant was needed for exposure, dissection, suctioning, retraction, instrument exchange, assisting with delivery with administration of fundal pressure, and for overall help during the procedure.  Caryl Ada, DO OB Fellow Center for [facility], [facility]

## 2018-02-27 NOTE — Anesthesia Postprocedure Evaluation (Signed)
Anesthesia Post Note  Patient: Le Bezek  Procedure(s) Performed: REPEAT CESAREAN SECTION WITH BILATERAL TUBAL LIGATION (N/A Abdomen)     Patient location during evaluation: Mother Baby Anesthesia Type: Spinal Level of consciousness: awake and alert Pain management: pain level controlled Vital Signs Assessment: post-procedure vital signs reviewed and stable Respiratory status: spontaneous breathing, nonlabored ventilation and respiratory function stable Cardiovascular status: stable Postop Assessment: no headache, no backache, epidural receding, no apparent nausea or vomiting, able to ambulate, patient able to bend at knees and adequate PO intake Anesthetic complications: no    Last Vitals:  Vitals:   02/27/18 1245 02/27/18 1257  BP: (!) 147/87 136/90  Pulse: (!) 108 69  Resp: 16 16  Temp:  36.6 C  SpO2: 93% 100%    Last Pain:  Vitals:   02/27/18 1315  TempSrc:   PainSc: 0-No pain   Pain Goal:                 [employer]

## 2018-02-27 NOTE — Lactation Note (Signed)
This note was copied from a baby's chart. Lactation Consultation Note  Patient Name: Katie Love ZOXWR'U Date: 02/27/2018 Reason for consult: Initial assessment   Initial assessment with Exp BF mom of 4 hour old infant. Infant STS with mom and in a deep sleep. Mom reports he fed well after delivery and she has tried a few more times since.   Infant with 2 BF for 10-25 minutes, attempt x 1 since delivery. LATCH scores 8-9.   Enc mom to feed infant STS 8-12 x n 24 hours at first feeding cues. Enc mom to use good pillow and head support with feedings. Enc mom to offer both breasts with each feeding.   BF Resources handout and LC Brochure given, mom informed of IP/OP Services, BF Support Groups and LC phone #. Mom to call insurance company to inquire about a pump. Mom has no questions/concerns at this time. Mom to call out for feeding assistance as needed.    Maternal Data Formula Feeding for Exclusion: No Has patient been taught Hand Expression?: Yes Does the patient have breastfeeding experience prior to this delivery?: Yes  Feeding Feeding Type: Breast Fed Length of feed: 25 min  LATCH Score Latch: Grasps breast easily, tongue down, lips flanged, rhythmical sucking.  Audible Swallowing: A few with stimulation  Type of Nipple: Everted at rest and after stimulation  Comfort (Breast/Nipple): Soft / non-tender  Hold (Positioning): Assistance needed to correctly position infant at breast and maintain latch.  LATCH Score: 8  Interventions Interventions: Breast feeding basics reviewed;Support pillows;Skin to skin  Lactation Tools Discussed/Used WIC Program: No   Consult Status Consult Status: Follow-up Date: 02/28/18 Follow-up type: In-patient    Katie Love 02/27/2018, 3:08 PM

## 2018-02-27 NOTE — Lactation Note (Signed)
This note was copied from a baby's chart. Lactation Consultation Note  Patient Name: Katie Love ZOXWR'U Date: 02/27/2018 Reason for consult: Follow-up assessment  9 hours old FT female who is being exclusively BF by his mother. RN called because mom requested LC assistance; baby is too sleepy to latch. Mom had baby swaddled in a blanket and trying to get him to the breast when entering the room. Offered assistance with latch, undressed baby down to his diaper and hat and placed him STS with mom. LC tried to latch baby to the left breast in football position but baby did not wake up, not even briefly.  Per mom, she also had to use the bulb to aspirate some fluid; baby was very juicy; he was born through a C-section. Tried to hand express both of mom's breast but nothing came out yet, no colostrum was seen. Both of mom's nipples looked intact upon examination, with no signs of trauma. Advised mom to keep baby STS if not latching on. If baby is not cueing in a 3 hour period, mom will give him an opportunity to feed placing him STS to the breast.  Encouraged mom to keep feeding baby 8-12 times/24 hours or sooner if feeding cues are present. She's aware of LC services and will call PRN.  Maternal Data    Feeding Feeding Type: Breast Fed Length of feed: 10 min  LATCH Score Latch: Repeated attempts needed to sustain latch, nipple held in mouth throughout feeding, stimulation needed to elicit sucking reflex.  Audible Swallowing: A few with stimulation  Type of Nipple: Everted at rest and after stimulation  Comfort (Breast/Nipple): Soft / non-tender  Hold (Positioning): Assistance needed to correctly position infant at breast and maintain latch.  LATCH Score: 7  Interventions Interventions: Breast feeding basics reviewed;Assisted with latch;Skin to skin;Breast massage;Hand express;Breast compression;Adjust position;Support pillows  Lactation Tools Discussed/Used     Consult  Status Consult Status: Follow-up Date: 02/28/18 Follow-up type: In-patient    [name] [name] 02/27/2018, 7:42 PM

## 2018-02-27 NOTE — Addendum Note (Signed)
Addendum  created 02/27/18 1352 by Elgie Congo, CRNA   Charge Capture section accepted, Sign clinical note

## 2018-02-27 NOTE — Transfer of Care (Signed)
Immediate Anesthesia Transfer of Care Note  Patient: Katie Love  Procedure(s) Performed: REPEAT CESAREAN SECTION WITH BILATERAL TUBAL LIGATION (N/A Abdomen)  Patient Location: PACU  Anesthesia Type:Spinal  Level of Consciousness: awake, alert  and oriented  Airway & Oxygen Therapy: Patient Spontanous Breathing  Post-op Assessment: Report given to RN and Post -op Vital signs reviewed and stable  Post vital signs: Reviewed and stable  Last Vitals:  Vitals Value Taken Time  BP    Temp    Pulse 70 02/27/2018 11:10 AM  Resp    SpO2 98 % 02/27/2018 11:10 AM  Vitals shown include unvalidated device data.  Last Pain:  Vitals:   02/27/18 0805  TempSrc: Oral         Complications: No apparent anesthesia complications

## 2018-02-27 NOTE — Anesthesia Procedure Notes (Signed)
Spinal  Patient location during procedure: OB Start time: 02/27/2018 9:35 AM End time: 02/27/2018 9:40 AM Staffing Anesthesiologist: Trevor Iha, MD Performed: anesthesiologist  Preanesthetic Checklist Completed: patient identified, surgical consent, pre-op evaluation, timeout performed, IV checked, risks and benefits discussed and monitors and equipment checked Spinal Block Patient position: sitting Prep: site prepped and draped and DuraPrep Patient monitoring: heart rate, cardiac monitor, continuous pulse ox and blood pressure Approach: midline Location: L3-4 Injection technique: single-shot Needle Needle type: Pencan  Needle gauge: 24 G Needle length: 10 cm Assessment Sensory level: T4

## 2018-02-27 NOTE — H&P (Addendum)
Obstetric Preoperative History and Physical  Katie Love is a 32 y.o. [id number] with IUP at [redacted]w[redacted]d presenting for scheduled cesarean section. Prior c-section x2. No acute concerns. Low-risk pregnancy.   Prenatal Course Source of Care: [facility]  with onset of care at 18.6 weeks Pregnancy complications or risks: Patient Active Problem List   Diagnosis Date Noted  . Large for gestational age fetus affecting management of mother, antepartum 01/31/2018  . History of cesarean delivery, currently pregnant 12/13/2017  . Biological false positive RPR test 10/11/2017  . Supervision of other normal pregnancy, antepartum 10/09/2017  . Recurrent vaginitis 03/25/2012   She plans to breastfeed She desires bilateral tubal ligation for postpartum contraception.   Prenatal labs and studies: ABO, Rh: --/--/AB POS (05/01 1003) Antibody: NEG (05/01 1003) Rubella: 12.80 (12/12 1143) RPR: Non Reactive (05/01 0948)  HBsAg: NON-REACTIVE (12/12 1143)  HIV: NON-REACTIVE (03/27 1139)  GBS: UNKNOWN 1 hr Glucola  139 - abnormal; refused 3hr GTT Genetic screening normal Anatomy US normal  Prenatal Transfer Tool  Maternal Diabetes: No Genetic Screening: Normal Maternal Ultrasounds/Referrals: Normal Fetal Ultrasounds or other Referrals:  None Maternal Substance Abuse:  No Significant Maternal Medications:  None Significant Maternal Lab Results: None  Past Medical History:  Diagnosis Date  . Medical history non-contributory   . No pertinent past medical history     Past Surgical History:  Procedure Laterality Date  . CESAREAN SECTION  2008  . CESAREAN SECTION N/A 01/02/2013   Procedure: CESAREAN SECTION;  Surgeon: Lesly Dukes, MD;  Location: [facility];  Service: Obstetrics;  Laterality: N/A;    OB History  Gravida Para Term Preterm AB Living  SAB TAB Ectopic Multiple Live Births          2    # Outcome Date GA Lbr Len/2nd Weight Sex Delivery Anes PTL Lv  3 Current           2 Term  01/02/13 [redacted]w[redacted]d  3.305 kg (7 lb 4.6 oz) M CS-LTranv Spinal  LIV  1 Term 2008    M CS-LTranv   LIV    Social History   Socioeconomic History  . Marital status: Single    Spouse name: Not on file  . Number of children: Not on file  . Years of education: Not on file  . Highest education level: Not on file  Occupational History  . Occupation: nurse  Social Needs  . Financial resource strain: Not on file  . Food insecurity:    Worry: Not on file    Inability: Not on file  . Transportation needs:    Medical: Not on file    Non-medical: Not on file  Tobacco Use  . Smoking status: Never Smoker  . Smokeless tobacco: Never Used  Substance and Sexual Activity  . Alcohol use: No  . Drug use: No  . Sexual activity: Yes    Partners: Male    Birth control/protection: None  Lifestyle  . Physical activity:    Days per week: Not on file    Minutes per session: Not on file  . Stress: Not on file  Relationships  . Social connections:    Talks on phone: Not on file    Gets together: Not on file    Attends religious service: Not on file    Active member of club or organization: Not on file    Attends meetings of clubs or organizations: Not on file  Relationship status: Not on file  Other Topics Concern  . Not on file  Social History Narrative  . Not on file    No family history on file.  Medications Prior to Admission  Medication Sig Dispense Refill Last Dose  . ondansetron (ZOFRAN-ODT) 4 MG disintegrating tablet DISSOLVE 1 TABLET ON TONGUE TWICE A DAY IF NEEDED  0 Not Taking  . Prenatal Multivit-Min-Fe-FA (PRENATAL VITAMINS PO) Take 1 tablet by mouth daily.    Taking    No Known Allergies  Review of Systems: Pertinent items noted in HPI and remainder of comprehensive ROS otherwise negative.  Physical Exam: LMP 05/30/2017  FHR by Doppler: 128 bpm CONSTITUTIONAL: Well-developed, well-nourished female in no acute distress.  HENT:  Normocephalic, atraumatic, External right  and left ear normal. Oropharynx is clear and moist EYES: Conjunctivae and EOM are normal. Pupils are equal, round, and reactive to light. No scleral icterus.  NECK: Normal range of motion, supple, no masses SKIN: Skin is warm and dry. No rash noted. Not diaphoretic. No erythema. No pallor. NEUROLGIC: Alert and oriented to person, place, and time. Normal reflexes, muscle tone coordination. No cranial nerve deficit noted. PSYCHIATRIC: Normal mood and affect. Normal behavior. Normal judgment and thought content. CARDIOVASCULAR: Normal heart rate noted, regular rhythm RESPIRATORY: Effort and breath sounds normal, no problems with respiration noted ABDOMEN: Soft, nontender, nondistended, gravid. Well-healed Pfannenstiel incision. PELVIC: Deferred MUSCULOSKELETAL: Normal range of motion. No edema and no tenderness. 2+ distal pulses.   Pertinent Labs/Studies:   Results for orders placed or performed during the hospital encounter of 02/26/18 (from the past 72 hour(s))  CBC     Status: Abnormal   Collection Time: 02/26/18  9:48 AM  Result Value Ref Range   WBC 4.7 4.0 - 10.5 K/uL   RBC 3.43 (L) 3.87 - 5.11 MIL/uL   Hemoglobin 10.3 (L) 12.0 - 15.0 g/dL   HCT 16.1 (L) 09.6 - 04.5 %   MCV 90.1 78.0 - 100.0 fL   MCH 30.0 26.0 - 34.0 pg   MCHC 33.3 30.0 - 36.0 g/dL   RDW 40.9 81.1 - 91.4 %   Platelets 237 150 - 400 K/uL    Comment: Performed at [facility], [address] [place], [place] [address]  RPR     Status: None   Collection Time: 02/26/18  9:48 AM  Result Value Ref Range   RPR Ser Ql Non Reactive Non Reactive    Comment: (NOTE) Performed At: [facility] [address] [place] [address] [place], [place] [id number] Jolene Schimke MD QI:[id number] Performed at [facility], [address], [place], [place] [address]   Type and screen [facility]      Status: None   Collection Time: 02/26/18 10:03 AM  Result Value Ref Range   ABO/RH(D) AB POS    Antibody Screen  NEG    Sample Expiration      03/01/2018 Performed at [facility], [address], [place], [place] [address]     Assessment and Plan :Katie Love is a 32 y.o. G3P2002 at [redacted]w[redacted]d being admitted for scheduled cesarean section. The risks of cesarean section discussed with the patient included but were not limited to: bleeding which may require transfusion or reoperation; infection which may require antibiotics; injury to bowel, bladder, ureters or other surrounding organs; injury to the fetus; need for additional procedures including hysterectomy in the event of a life-threatening hemorrhage; placental abnormalities wth subsequent pregnancies, incisional problems, thromboembolic phenomenon and other postoperative/anesthesia complications.   Patient  desires permanent sterilization.  Other reversible forms of contraception were discussed with patient; she declines all other modalities. Risks of procedure discussed with patient including but not limited to: risk of regret, permanence of method, bleeding, infection, injury to surrounding organs and need for additional procedures.  Failure risk of 1-2 % with increased risk of ectopic gestation if pregnancy occurs was also discussed with patient.  Patient verbalized understanding of these risks and wants to proceed with sterilization.   The patient concurred with the proposed plan, giving informed written consent for the procedure. Patient has been NPO since last night she will remain NPO for procedure. Anesthesia and OR aware. Preoperative prophylactic antibiotics and SCDs ordered on call to the OR. To OR when ready.   Caryl Ada, DO OB Fellow Faculty Practice, [facility] 02/27/2018, 9:10 AM  Pt seen and examined.  Consent repeated.  BTL competed with Filshie clips.  Pt aware that these remain inside. Attestation of Attending Supervision of Fellow: Evaluation and management procedures were performed by the Fellow under my  supervision and collaboration. I have reviewed the Fellow's note and chart, and I agree with the management and plan.  Elsie Lincoln, MD

## 2018-02-28 LAB — CBC
HEMATOCRIT: 26.9 % — AB (ref 36.0–46.0)
HEMOGLOBIN: 9.2 g/dL — AB (ref 12.0–15.0)
MCH: 30.3 pg (ref 26.0–34.0)
MCHC: 34.2 g/dL (ref 30.0–36.0)
MCV: 88.5 fL (ref 78.0–100.0)
Platelets: 239 10*3/uL (ref 150–400)
RBC: 3.04 MIL/uL — ABNORMAL LOW (ref 3.87–5.11)
RDW: 14.5 % (ref 11.5–15.5)
WBC: 17.6 10*3/uL — AB (ref 4.0–10.5)

## 2018-02-28 NOTE — Progress Notes (Signed)
Subjective: Postpartum Day 1: Cesarean Delivery Patient reports incisional pain, tolerating PO and no problems voiding.    Objective: Vital signs in last 24 hours: Temp:  [97.5 F (36.4 C)-98.2 F (36.8 C)] 98.2 F (36.8 C) (05/03 0827) Pulse Rate:  [63-108] 67 (05/03 0827) Resp:  [14-22] 18 (05/03 0827) BP: (96-147)/(53-97) 96/62 (05/03 0827) SpO2:  [93 %-100 %] 99 % (05/03 0827)  Physical Exam:  General: alert and cooperative Lochia: appropriate Uterine Fundus: firm Incision: some staining of bandage, no peeling of underlying honeycomb DVT Evaluation: No evidence of DVT seen on physical exam. Negative Homan's sign.  Recent Labs    02/27/18 1358 02/28/18 0542  HGB 11.6* 9.2*  HCT 34.6* 26.9*    Assessment/Plan: Status post Cesarean section. Doing well postoperatively.  Continue current care Hgb 11.6->9.2.  Myrene Buddy 02/28/2018, 9:00 AM

## 2018-02-28 NOTE — Lactation Note (Signed)
This note was copied from a baby'Love chart. Lactation Consultation Note  Patient Name: Katie Love ZOXWR'U Date: 02/28/2018   RN called for Lactation assistance due to mother'Love request, baby has already started formula supplementation; he'Love on double phototherapy. Mother had baby on blue blanket when entering the room but she refused LC assistance. She voiced she'll wait till the morning to seek [facility] services. LC verbalized understanding and apologize for waiting time explaining there'Love only 1 LC for the entire hospital during evening/night hours. Encouraged mom to call her RN if she needs further assistance overnight.   Maternal Data    Feeding Feeding Type: Breast Fed    Interventions    Lactation Tools Discussed/Used     Consult Status      Katie Love Katie Love 02/28/2018, 11:22 PM

## 2018-02-28 NOTE — Progress Notes (Signed)
Mom told to call for pain meds when needed plan of care today explained. Questions answered. Mom told she can get up on her own if she is not dizzy. Told to keep up with yellow feeding sheet.

## 2018-02-28 NOTE — Progress Notes (Signed)
MD notified of honeycomb saturated. Pressure dressing will be applied per MD order. Patient encouraged to walk halls . Patient states she in not passing gas yet. Incentive spriometer encouraged.

## 2018-03-01 MED ORDER — IBUPROFEN 600 MG PO TABS: 600.0000 mg | ORAL_TABLET | Freq: Four times a day (QID) | ORAL | 0 refills | Status: DC

## 2018-03-01 MED ORDER — OXYCODONE HCL 5 MG PO TABS: 5.0000 mg | ORAL_TABLET | Freq: Four times a day (QID) | ORAL | 0 refills | Status: DC | PRN

## 2018-03-01 NOTE — Discharge Summary (Signed)
OB Discharge Summary     Patient Name: Katie Love DOB: May 19, 1986 MRN: [id number]  Date of admission: 02/27/2018 Delivering MD: Elsie Lincoln H   Date of discharge: 03/01/2018  Admitting diagnosis: RCS Undesired Fertility Intrauterine pregnancy: [redacted]w[redacted]d     Secondary diagnosis:  Active Problems:   Status post repeat low transverse cesarean section  Additional problems:  S/p Bilateral Tubal Ligation LGA Fetus (>90% at 34 weeks)     Discharge diagnosis: Term Pregnancy Delivered                                                                                                Post partum procedures:none  Augmentation: none  Complications: None  Hospital course:  Sceduled C/S   32 y.o. yo G3P3003 at [redacted]w[redacted]d was admitted to the hospital 02/27/2018 for scheduled cesarean section with the following indication:Elective Repeat.  Membrane Rupture Time/Date: 10:08 AM ,02/27/2018   Patient delivered a Viable infant.02/27/2018  Details of operation can be found in separate operative note.  Pateint had an uncomplicated postpartum course.  She is ambulating, tolerating a regular diet, passing flatus, and urinating well. Patient is discharged home in stable condition on  03/01/18        She did have some bleeding along the right side of her bandage that appeared to have resolved at the time of discharge, after a pressure dressing was applied to the area.   Physical exam  Vitals:   02/28/18 0211 02/28/18 0509 02/28/18 0827 03/01/18 0659  BP: 108/70 (!) 99/53 96/62 125/81  Pulse: 78 63 67 61  Resp: Temp:  98.1 F (36.7 C) 98.2 F (36.8 C)   TempSrc: Axillary Oral    SpO2: 99% 97% 99%   Weight:      Height:       General: alert, cooperative and no distress Lochia: appropriate Uterine Fundus: firm Incision: Some bleeding along the right lateral border, but appears to have stopped at this time.  DVT Evaluation: No evidence of DVT seen on physical exam. Labs: Lab Results   Component Value Date   WBC 17.6 (H) 02/28/2018   HGB 9.2 (L) 02/28/2018   HCT 26.9 (L) 02/28/2018   MCV 88.5 02/28/2018   PLT 239 02/28/2018   CMP Latest Ref Rng & Units 02/27/2018  Glucose 65 - 99 mg/dL -  BUN 7 - 25 mg/dL -  Creatinine 5.28 - 4.13 mg/dL 2.44  Sodium 010 - 272 mmol/L -  Potassium 3.5 - 5.3 mmol/L -  Chloride 98 - 110 mmol/L -  CO2 20 - 32 mmol/L -  Calcium 8.6 - 10.2 mg/dL -  Total Protein 6.1 - 8.1 g/dL -  Total Bilirubin 0.2 - 1.2 mg/dL -  Alkaline Phos - -  AST 10 - 30 U/L -  ALT 6 - 29 U/L -    Discharge instruction: per After Visit Summary and "Baby and Me Booklet".  After visit meds:  Allergies as of 03/01/2018   No Known Allergies     Medication List    TAKE these medications   ibuprofen 600  MG tablet Commonly known as:  ADVIL,MOTRIN Take 1 tablet (600 mg total) by mouth every 6 (six) hours.   ondansetron 4 MG disintegrating tablet Commonly known as:  ZOFRAN-ODT DISSOLVE 1 TABLET ON TONGUE TWICE A DAY IF NEEDED   oxyCODONE 5 MG immediate release tablet Commonly known as:  Oxy IR/ROXICODONE Take 1 tablet (5 mg total) by mouth every 6 (six) hours as needed (pain scale 4-7).   PRENATAL VITAMINS PO Take 1 tablet by mouth daily.       Diet: routine diet  Activity: Advance as tolerated. Pelvic rest for 6 weeks.   Outpatient follow up:Follow-up 1 week for incision evaluation and in 4-6 weeks for routine post partum care Follow up Appt:No future appointments. Follow up Visit:No follow-ups on file.  Postpartum contraception: Tubal Ligation  Newborn Data: Live born female  Birth Weight: 8 lb 5.9 oz (3795 g) APGAR: 8, 9  Newborn Delivery   Birth date/time:  02/27/2018 10:09:00 Delivery type:  C-Section, Low Transverse Trial of labor:  No C-section categorization:  Repeat     Baby Feeding: Breast Disposition:home with mother   03/01/2018 Gorden Harms, MD

## 2018-03-01 NOTE — Discharge Instructions (Signed)

## 2018-03-01 NOTE — Lactation Note (Signed)
This note was copied from a baby's chart. Lactation Consultation Note  Patient Name: Katie Love [id number] Date: 03/01/2018 Reason for consult: Follow-up assessment;Term;Hyperbilirubinemia;Infant weight loss(mom sound asleep, baby sleeping and not showing signs of hunger / 5 % weight loss )  Baby is 38 hours old  LC will pass onto the oncoming LC to consult with mother./ baby.   Maternal Data    Feeding Feeding Type: Breast Fed  LATCH Score ( BY the MBURN (  Latch: Grasps breast easily, tongue down, lips flanged, rhythmical sucking.  Audible Swallowing: Spontaneous and intermittent  Type of Nipple: Everted at rest and after stimulation  Comfort (Breast/Nipple): Soft / non-tender  Hold (Positioning): No assistance needed to correctly position infant at breast.  LATCH Score: 10  Interventions Interventions: Breast feeding basics reviewed  Lactation Tools Discussed/Used     Consult Status Consult Status: Follow-up Date: 03/01/18 Follow-up type: In-patient    Katie Love 03/01/2018, 3:15 PM

## 2018-03-02 MED ORDER — FERROUS SULFATE 325 (65 FE) MG PO TABS: 325.0000 mg | ORAL_TABLET | Freq: Every day | ORAL | 0 refills | Status: DC

## 2018-03-02 NOTE — Discharge Summary (Signed)
OB Discharge Summary  Patient Name: Katie Love DOB: 08-Oct-1986 MRN: [id number]  Date of admission: 02/27/2018 Delivering MD: Elsie Lincoln H   Date of discharge: 03/02/2018  Admitting diagnosis: RCS Undesired Fertility Intrauterine pregnancy: [redacted]w[redacted]d     Secondary diagnosis:Active Problems:   Status post repeat low transverse cesarean section  Additional problems:none     Discharge diagnosis: Term Pregnancy Delivered                                                                     Post partum procedures:n/a  Augmentation: n/a  Complications: None  Hospital course:  Sceduled C/S   32 y.o. yo G3P3003 at [redacted]w[redacted]d was admitted to the hospital 02/27/2018 for scheduled cesarean section with the following indication:Elective Repeat.  Membrane Rupture Time/Date: 10:08 AM ,02/27/2018   Patient delivered a Viable infant.02/27/2018  Details of operation can be found in separate operative note.  Pateint had an uncomplicated postpartum course.  She is ambulating, tolerating a regular diet, passing flatus, and urinating well. Patient is discharged home in stable condition on  03/02/18         Physical exam  Vitals:   02/28/18 0827 03/01/18 0659 03/01/18 1934 03/02/18 0605  BP: 96/62 125/81 125/73 139/84  Pulse: 67 61 74 66  Resp: Temp: 98.2 F (36.8 C)  98.6 F (37 C) 98 F (36.7 C)  TempSrc:   Oral   SpO2: 99%  99% 99%  Weight:      Height:       General: alert, cooperative and no distress Lochia: appropriate Uterine Fundus: firm Incision: Healing well with no significant drainage, No significant erythema, Dressing is clean, dry, and intact DVT Evaluation: No evidence of DVT seen on physical exam. Labs: Lab Results  Component Value Date   WBC 17.6 (H) 02/28/2018   HGB 9.2 (L) 02/28/2018   HCT 26.9 (L) 02/28/2018   MCV 88.5 02/28/2018   PLT 239 02/28/2018   CMP Latest Ref Rng & Units 02/27/2018  Glucose 65 - 99 mg/dL -  BUN 7 - 25 mg/dL -  Creatinine  4.09 - 8.11 mg/dL 9.14  Sodium 782 - 956 mmol/L -  Potassium 3.5 - 5.3 mmol/L -  Chloride 98 - 110 mmol/L -  CO2 20 - 32 mmol/L -  Calcium 8.6 - 10.2 mg/dL -  Total Protein 6.1 - 8.1 g/dL -  Total Bilirubin 0.2 - 1.2 mg/dL -  Alkaline Phos - -  AST 10 - 30 U/L -  ALT 6 - 29 U/L -    Discharge instruction: per After Visit Summary and "Baby and Me Booklet".  After Visit Meds:  Allergies as of 03/02/2018   No Known Allergies     Medication List    TAKE these medications   ibuprofen 600 MG tablet Commonly known as:  ADVIL,MOTRIN Take 1 tablet (600 mg total) by mouth every 6 (six) hours.   ondansetron 4 MG disintegrating tablet Commonly known as:  ZOFRAN-ODT DISSOLVE 1 TABLET ON TONGUE TWICE A DAY IF NEEDED   oxyCODONE 5 MG immediate release tablet Commonly known as:  Oxy IR/ROXICODONE Take 1 tablet (5 mg total) by mouth every 6 (six) hours as needed (pain scale  4-7).   PRENATAL VITAMINS PO Take 1 tablet by mouth daily.       Diet: routine diet  Activity: Advance as tolerated. Pelvic rest for 6 weeks.   Outpatient follow up:6 weeks Follow up Appt:No future appointments. Follow up visit: No follow-ups on file.  Postpartum contraception: Tubal Ligation  Newborn Data: Live born female  Birth Weight: 8 lb 5.9 oz (3795 g) APGAR: 8, 9  Newborn Delivery   Birth date/time:  02/27/2018 10:09:00 Delivery type:  C-Section, Low Transverse Trial of labor:  No C-section categorization:  Repeat     Baby Feeding: Breast Disposition:home with mother   03/02/2018 Wyvonnia Dusky, CNM
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
## 2018-03-02 NOTE — Progress Notes (Signed)
Order was placed to change dressing on abdomen.  Pt declined changing the dressing, instructed to remove dressing in two days and reviewed incision care and signs of infection.  Pt acknowledged instructions.

## 2018-03-02 NOTE — Lactation Note (Signed)
This note was copied from a baby's chart. Lactation Consultation Note  Patient Name: Katie Love ZOXWR'U Date: 03/02/2018   [facility] Follow UP Visit:  Follow up due to MD request:  G3P3 mother whose infant is now 7 hours old.  Mother was bottle feeding infant when I arrived.  Mother has been mostly breastfeeding but has recently added formula due to the baby needing more than she can provide.  She fed him 30 mls at 2300 and another 30 mls at 2330.  Mother does not have any questions/concerns at this time and was appreciative of the follow up visit.                         Jihad Brownlow R Reginald Mangels 03/02/2018, 1:30 AM

## 2018-03-03 ENCOUNTER — Telehealth: Payer: Self-pay | Admitting: *Deleted

## 2018-03-03 NOTE — Telephone Encounter (Signed)
-----   Message from Pincus Large, DO sent at 02/27/2018 12:11 PM EDT ----- Please schedule this patient for Postpartum visit in: 4 weeks with the following provider: Any provider For C/S patients schedule nurse incision check in weeks 2 weeks: yes Low risk pregnancy complicated by: N/A Delivery mode:  CS Anticipated Birth Control:  BTL done PP PP Procedures needed: Incision check  Schedule Integrated BH visit: no

## 2018-03-03 NOTE — Telephone Encounter (Signed)
Patient called to schedule before I saw this message from Dr. Doroteo Glassman. She scheduled for 1 week incision check and 6 week Postpartum check per Dr. Penne Lash she stated. I called patient to change those appts. To 2 and 4 weeks per Dr. Doroteo Glassman.

## 2018-03-04 ENCOUNTER — Telehealth: Payer: Self-pay | Admitting: *Deleted

## 2018-03-04 NOTE — Telephone Encounter (Signed)
I called and left patient a voicemail x2 about changing appts to 2 week incision check instead of 1 week and 4 week postpartum instead of 6 week/7 week per pt request to see Dr. Penne Lash.

## 2018-03-06 ENCOUNTER — Telehealth: Payer: Self-pay | Admitting: *Deleted

## 2018-03-06 ENCOUNTER — Ambulatory Visit: Payer: BLUE CROSS/BLUE SHIELD

## 2018-03-06 MED ORDER — OXYCODONE HCL 5 MG PO TABS: 5.0000 mg | ORAL_TABLET | Freq: Four times a day (QID) | ORAL | 0 refills | Status: DC | PRN

## 2018-03-07 NOTE — Telephone Encounter (Signed)
Pt requested an additional 10 Oxycodone due to her C/Section.  Dr Marice Potter has authorized and I left a message on her phone that she will have to have someone pick up the written RX.

## 2018-03-13 ENCOUNTER — Ambulatory Visit: Payer: BLUE CROSS/BLUE SHIELD | Admitting: Obstetrics & Gynecology

## 2018-03-19 ENCOUNTER — Encounter: Payer: Self-pay | Admitting: Obstetrics & Gynecology

## 2018-03-19 ENCOUNTER — Ambulatory Visit (INDEPENDENT_AMBULATORY_CARE_PROVIDER_SITE_OTHER): Payer: BLUE CROSS/BLUE SHIELD | Admitting: Obstetrics & Gynecology

## 2018-03-19 VITALS — BP 137/79 | HR 74 | Resp 16 | Ht 62.0 in | Wt 168.0 lb

## 2018-03-19 DIAGNOSIS — Z9889 Other specified postprocedural states: Secondary | ICD-10-CM

## 2018-03-19 NOTE — Progress Notes (Addendum)
   Subjective:    Patient ID: Katie Love, female    DOB: [birth year], 32 y.o.   MRN: [id number]  HPI 32 yo lady here for an incision check. She is having no problems.  Review of Systems     Objective:   Physical Exam Breathing, conversing, and ambulating normally Abd- benign Incision- healing well     Assessment & Plan:  Incision check at 2 weeks- doing well Come back 4 weeks for postpartum exam

## 2018-03-26 ENCOUNTER — Ambulatory Visit: Payer: BLUE CROSS/BLUE SHIELD | Admitting: Obstetrics & Gynecology

## 2018-04-17 ENCOUNTER — Ambulatory Visit (INDEPENDENT_AMBULATORY_CARE_PROVIDER_SITE_OTHER): Payer: BLUE CROSS/BLUE SHIELD | Admitting: Obstetrics & Gynecology

## 2018-04-17 ENCOUNTER — Ambulatory Visit: Payer: BLUE CROSS/BLUE SHIELD | Admitting: Obstetrics & Gynecology

## 2018-04-17 ENCOUNTER — Encounter: Payer: Self-pay | Admitting: Obstetrics & Gynecology

## 2018-04-17 NOTE — Progress Notes (Signed)
Post Partum Exam  Katie Love is a 32 y.o. [id number] female who presents for a postpartum visit. She is 7 weeks postpartum following a low cervical transverse Cesarean section. I have fully reviewed the prenatal and intrapartum course. The delivery was at 39 gestational weeks.  Anesthesia: spinal. Postpartum course has been unremarkable. Baby's course has been unremarkable. Baby is feeding by breast. Bleeding no bleeding. Bowel function is normal. Bladder function is normal. Patient is sexually active. Contraception method is tubal ligation. Postpartum depression screening:neg  The following portions of the patient's history were reviewed and updated as appropriate: allergies, current medications, past family history, past medical history, past social history, past surgical history and problem list.  Review of Systems Pertinent items noted in HPI and remainder of comprehensive ROS otherwise negative.    Objective:  currently breastfeeding.  General:  alert, cooperative and no distress   Breasts:  inspection negative, no nipple discharge or bleeding, no masses or nodularity palpable  Lungs: clear to auscultation bilaterally  Heart:  regular rate and rhythm  Abdomen: soft, non-tender; bowel sounds normal; no masses,  no organomegaly   Vulva:  not evaluated  Vagina: not evaluated  Cervix:  not evaluated  Corpus: not examined  Adnexa:  not evaluated  Rectal Exam: Not performed.        Assessment:    nml postpartum exam.  Plan:   1. Contraception: tubal ligation 2. 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 Follow up in: 1 year or as needed.

## 2018-04-24 ENCOUNTER — Encounter (INDEPENDENT_AMBULATORY_CARE_PROVIDER_SITE_OTHER): Payer: Self-pay

## 2020-01-06 ENCOUNTER — Other Ambulatory Visit: Payer: Self-pay

## 2020-01-06 ENCOUNTER — Ambulatory Visit (HOSPITAL_COMMUNITY)
Admission: EM | Admit: 2020-01-06 | Discharge: 2020-01-06 | Disposition: A | Discharge status: Home / Self Care | Payer: Self-pay | Attending: Family Medicine | Admitting: Family Medicine

## 2020-01-06 ENCOUNTER — Ambulatory Visit (INDEPENDENT_AMBULATORY_CARE_PROVIDER_SITE_OTHER): Payer: Self-pay

## 2020-01-06 ENCOUNTER — Encounter (HOSPITAL_COMMUNITY): Payer: Self-pay

## 2020-01-06 DIAGNOSIS — M542 Cervicalgia: Secondary | ICD-10-CM

## 2020-01-06 DIAGNOSIS — M25511 Pain in right shoulder: Secondary | ICD-10-CM

## 2020-01-06 DIAGNOSIS — M25512 Pain in left shoulder: Secondary | ICD-10-CM

## 2020-01-06 DIAGNOSIS — S161XXA Strain of muscle, fascia and tendon at neck level, initial encounter: Secondary | ICD-10-CM

## 2020-01-06 MED ORDER — METHYLPREDNISOLONE SODIUM SUCC 125 MG IJ SOLR
125.0000 mg | Freq: Once | INTRAMUSCULAR | Status: AC
  Administered 2020-01-06: 125 mg via INTRAMUSCULAR

## 2020-01-06 MED ORDER — METHYLPREDNISOLONE SODIUM SUCC 125 MG IJ SOLR
INTRAMUSCULAR | Status: AC
  Filled 2020-01-06: qty 2

## 2020-01-06 MED ORDER — KETOROLAC TROMETHAMINE 60 MG/2ML IM SOLN
60.0000 mg | Freq: Once | INTRAMUSCULAR | Status: AC
  Administered 2020-01-06: 60 mg via INTRAMUSCULAR

## 2020-01-06 MED ORDER — KETOROLAC TROMETHAMINE 60 MG/2ML IM SOLN
INTRAMUSCULAR | Status: AC
  Filled 2020-01-06: qty 2

## 2020-01-06 MED ORDER — CYCLOBENZAPRINE HCL 10 MG PO TABS: 10.0000 mg | ORAL_TABLET | Freq: Every day | ORAL | 0 refills | Status: DC

## 2020-01-06 MED ORDER — MELOXICAM 15 MG PO TABS: 15.0000 mg | ORAL_TABLET | Freq: Every day | ORAL | 1 refills | Status: DC

## 2020-01-06 NOTE — Discharge Instructions (Signed)
Take meloxicam 15 mg once daily as needed for anti-inflammatory action. Cyclobenzaprine 10 mg prescribed as needed for bedtime for neck and back and shoulder pain. If no improvement of symptoms within 5 to 7 days follow-up at [facility] orthopedic office.  Contact information included on your discharge paperwork
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
## 2020-09-21 ENCOUNTER — Other Ambulatory Visit: Payer: Self-pay

## 2020-09-21 ENCOUNTER — Encounter (HOSPITAL_COMMUNITY): Payer: Self-pay

## 2020-09-21 ENCOUNTER — Ambulatory Visit: Payer: Self-pay

## 2020-09-21 ENCOUNTER — Ambulatory Visit (HOSPITAL_COMMUNITY)
Admission: EM | Admit: 2020-09-21 | Discharge: 2020-09-21 | Disposition: A | Discharge status: Home / Self Care | Payer: Self-pay | Attending: Family Medicine | Admitting: Family Medicine

## 2020-09-21 DIAGNOSIS — S39012A Strain of muscle, fascia and tendon of lower back, initial encounter: Secondary | ICD-10-CM

## 2020-09-21 DIAGNOSIS — S161XXA Strain of muscle, fascia and tendon at neck level, initial encounter: Secondary | ICD-10-CM

## 2020-09-21 MED ORDER — MELOXICAM 15 MG PO TABS: 15.0000 mg | ORAL_TABLET | Freq: Every day | ORAL | 0 refills | Status: DC
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
## 2021-03-08 ENCOUNTER — Ambulatory Visit (HOSPITAL_COMMUNITY): Payer: Self-pay

## 2021-03-13 ENCOUNTER — Ambulatory Visit: Payer: Self-pay | Admitting: Family Medicine

## 2021-03-15 ENCOUNTER — Encounter: Payer: Self-pay | Admitting: Family Medicine

## 2021-03-15 ENCOUNTER — Other Ambulatory Visit: Payer: Self-pay

## 2021-03-15 ENCOUNTER — Ambulatory Visit (INDEPENDENT_AMBULATORY_CARE_PROVIDER_SITE_OTHER): Payer: Self-pay | Admitting: Family Medicine

## 2021-03-15 VITALS — BP 108/82 | Ht 63.0 in | Wt 169.0 lb

## 2021-03-15 DIAGNOSIS — S161XXA Strain of muscle, fascia and tendon at neck level, initial encounter: Secondary | ICD-10-CM

## 2021-03-15 DIAGNOSIS — S46019A Strain of muscle(s) and tendon(s) of the rotator cuff of unspecified shoulder, initial encounter: Secondary | ICD-10-CM

## 2021-03-15 DIAGNOSIS — S39012A Strain of muscle, fascia and tendon of lower back, initial encounter: Secondary | ICD-10-CM
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
## 2021-03-15 NOTE — Patient Instructions (Signed)
You have strains of your rotator cuff, trapezius, posture muscles of your low back. Ok to take tylenol for baseline pain relief (1-2 extra strength tabs 3x/day) Take aleve or ibuprofen with food for pain and inflammation as needed. Consider muscle relaxant. Stay as active as possible. Do home exercises and stretches as directed - hold each for 20-30 seconds and do each one three times. Start physical therapy and do home exercises on days you don't go to therapy. Strengthening of neck and back muscles, abdominal musculature are key for long term pain relief. If not improving, will consider further imaging (MRI). Follow up with me in 6 weeks.

## 2021-03-16 ENCOUNTER — Encounter: Payer: Self-pay | Admitting: Physical Therapy

## 2021-03-16 ENCOUNTER — Ambulatory Visit: Payer: Self-pay | Attending: Family Medicine | Admitting: Physical Therapy

## 2021-03-16 DIAGNOSIS — M25512 Pain in left shoulder: Secondary | ICD-10-CM | POA: Insufficient documentation

## 2021-03-16 DIAGNOSIS — M25511 Pain in right shoulder: Secondary | ICD-10-CM | POA: Insufficient documentation

## 2021-03-16 DIAGNOSIS — M6281 Muscle weakness (generalized): Secondary | ICD-10-CM | POA: Insufficient documentation

## 2021-03-16 DIAGNOSIS — M545 Low back pain, unspecified: Secondary | ICD-10-CM | POA: Insufficient documentation

## 2021-03-16 DIAGNOSIS — G8929 Other chronic pain: Secondary | ICD-10-CM | POA: Insufficient documentation
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
## 2021-03-16 NOTE — Patient Instructions (Signed)
Access Code: [email or web address] URL: [email or web address] Date: 03/16/2021 Prepared by: Alphonzo Severance  Exercises Standing Shoulder Row with Anchored Resistance - 2 x daily - 7 x weekly - 3 sets - 10 reps Supine Lower Trunk Rotation - 2 x daily - 7 x weekly - 3 sets - 10 reps Supine Single Knee to Chest Stretch - 2 x daily - 7 x weekly - 1 sets - 3 reps - 45 hold

## 2021-03-16 NOTE — Addendum Note (Signed)
Addended by: Fredderick Phenix on: 03/16/2021 03:21 PM   Modules accepted: Orders

## 2021-03-21 ENCOUNTER — Telehealth: Payer: Self-pay

## 2021-03-21 NOTE — Telephone Encounter (Signed)
Spoke with patient regarding missed PT appointment on 03/20/21. She reports she forgot about scheduled appointment. Confirmed next visit and reviewed attendance policy.

## 2021-03-22 ENCOUNTER — Ambulatory Visit: Payer: Self-pay

## 2021-03-22 ENCOUNTER — Other Ambulatory Visit: Payer: Self-pay

## 2021-03-22 DIAGNOSIS — M545 Low back pain, unspecified: Secondary | ICD-10-CM

## 2021-03-22 DIAGNOSIS — G8929 Other chronic pain: Secondary | ICD-10-CM

## 2021-03-22 DIAGNOSIS — M6281 Muscle weakness (generalized): Secondary | ICD-10-CM

## 2021-03-22 DIAGNOSIS — M25512 Pain in left shoulder: Secondary | ICD-10-CM

## 2021-03-22 DIAGNOSIS — M25511 Pain in right shoulder: Secondary | ICD-10-CM

## 2021-03-22 NOTE — Therapy (Addendum)
[facility] Outpatient Rehabilitation [facility] [address] [place], [place], [address] Phone: [phone]   Fax:  [phone]  Physical Therapy Treatment  Patient Details  Name: Katie Love MRN: [id number] Date of Birth: 1986-06-02 No data recorded  Encounter Date: 03/22/2021   PT End of Session - 03/22/21 1106    Visit Number 2    Number of Visits 12    Date for PT Re-Evaluation 04/27/21    Authorization Type self pay - foto 6th and 10th    PT Start Time 1130    PT Stop Time 1210    PT Time Calculation (min) 40 min    Activity Tolerance Patient tolerated treatment well    Behavior During Therapy [facility] for tasks assessed/performed           Past Medical History:  Diagnosis Date  . Medical history non-contributory   . No pertinent past medical history     Past Surgical History:  Procedure Laterality Date  . CESAREAN SECTION  2008  . CESAREAN SECTION N/A 01/02/2013   Procedure: CESAREAN SECTION;  Surgeon: Lesly Dukes, MD;  Location: [facility];  Service: Obstetrics;  Laterality: N/A;  . CESAREAN SECTION WITH BILATERAL TUBAL LIGATION N/A 02/27/2018   Procedure: REPEAT CESAREAN SECTION WITH BILATERAL TUBAL LIGATION;  Surgeon: Lesly Dukes, MD;  Location: [facility] BIRTHING SUITES;  Service: Obstetrics;  Laterality: N/A;    There were no vitals filed for this visit.   Subjective Assessment - 03/22/21 1129    Subjective Pt presents to PT with reports of continued bilateral shoulder and lower back pain. She has not been compliant with her HEP per reports. Pt is ready to begin PT treatment at this time.    Currently in Pain? Yes    Pain Score 5     Pain Location Shoulder    Pain Orientation Right;Left    Pain Score 8    Pain Location Back    Pain Orientation Lower                             [facility] Adult PT Treatment/Exercise - 03/22/21 0001      Exercises   Exercises Shoulder;Lumbar      Lumbar Exercises: Stretches   Lower Trunk  Rotation Limitations x 10 ea side - 5 sec    Prone on Elbows Stretch 60 seconds      Lumbar Exercises: Aerobic   Nustep lvl 5 UE/LE x 5 min while taking subjective      Lumbar Exercises: Supine   Pelvic Tilt 2 reps;10 reps;5 seconds    Other Supine Lumbar Exercises hamstring ball rollouts x 10      Shoulder Exercises: Seated   External Rotation 10 reps;Theraband;Both   x 2   Theraband Level (Shoulder External Rotation) Level 1 (Yellow)      Shoulder Exercises: Standing   Extension 10 reps   x 2   Theraband Level (Shoulder Extension) Level 4 (Blue)    Row 10 reps;Theraband   x 2   Theraband Level (Shoulder Row) Level 4 (Blue)                  PT Education - 03/22/21 1221    Education Details HEP    Person(s) Educated Patient    Methods Handout;Demonstration    Comprehension Need further instruction;Returned demonstration  Plan - 03/22/21 1210    Clinical Impression Statement Pt was able to complete prescribed exercises with no adverse effect or increase in pain. Pt did require numerous verbal and tactile cues for maintaining proper technique and sequenicng of exercises. She continues to have significant core and periscapular muscle weakness. She notes slight discomfort with DKTC, with alleviation during POE exercise for increased lumbar extension. Pt continues to benefit from skilled PT services, with PT emphasizing need for HEP compliance to further progression.    Personal Factors and Comorbidities Profession    Examination-Activity Limitations Carry;Lift;Sit;Stand;Sleep;Bend;Stairs;Squat    Examination-Participation Restrictions Occupation;Community Activity    Stability/Clinical Decision Making --    Rehab Potential --    PT Frequency 2x / week    PT Duration 6 weeks    PT Treatment/Interventions ADLs/Self Care Home Management;Iontophoresis 4mg /ml Dexamethasone;Therapeutic exercise;Therapeutic activities;Neuromuscular  re-education;Manual techniques;Gait training;Dry needling    PT Next Visit Plan monitor HEP, progress strengthening and mobility of shoulder girdle and low back    PT Home Exercise Plan [email or web address]    Consulted and Agree with Plan of Care --           Patient will benefit from skilled therapeutic intervention in order to improve the following deficits and impairments:  Decreased activity tolerance,Decreased range of motion,Decreased strength,Impaired UE functional use,Pain,Decreased mobility  Visit Diagnosis: Right shoulder pain, unspecified chronicity  Chronic left shoulder pain  Low back pain, unspecified back pain laterality, unspecified chronicity, unspecified whether sciatica present  Muscle weakness     Problem List Patient Active Problem List   Diagnosis Date Noted  . Status post repeat low transverse cesarean section 02/27/2018  . Biological false positive RPR test 10/11/2017  . Recurrent vaginitis 03/25/2012    03/27/2012, PT, DPT 03/22/21 12:21 PM  [facility] [address] [place], [place], [place] Phone: [phone]   Fax:  [phone]  Name: Katie Love MRN: Shella Maxim Date of Birth: 1986/09/16

## 2021-04-03 ENCOUNTER — Ambulatory Visit: Payer: Self-pay

## 2021-04-05 ENCOUNTER — Ambulatory Visit: Payer: Self-pay | Admitting: Physical Therapy

## 2021-04-10 ENCOUNTER — Telehealth: Payer: Self-pay

## 2021-04-10 ENCOUNTER — Ambulatory Visit: Payer: Self-pay | Attending: Family Medicine

## 2021-04-10 DIAGNOSIS — M545 Low back pain, unspecified: Secondary | ICD-10-CM | POA: Insufficient documentation

## 2021-04-10 DIAGNOSIS — M6281 Muscle weakness (generalized): Secondary | ICD-10-CM | POA: Insufficient documentation

## 2021-04-10 DIAGNOSIS — M25512 Pain in left shoulder: Secondary | ICD-10-CM | POA: Insufficient documentation

## 2021-04-10 DIAGNOSIS — M25511 Pain in right shoulder: Secondary | ICD-10-CM | POA: Insufficient documentation

## 2021-04-10 DIAGNOSIS — G8929 Other chronic pain: Secondary | ICD-10-CM | POA: Insufficient documentation

## 2021-04-10 NOTE — Telephone Encounter (Signed)
PT called and spoke with patient regarding missed visit. Patient stated she is traveling and forgot to cancel this visit as well.   Reminded her of attendance policy. All visits except next were cancelled and patient can only schedule one at a time right now.   Confirmed next appt.   Eloy End, PT, DPT 04/10/21 11:27 AM

## 2021-04-12 ENCOUNTER — Ambulatory Visit: Payer: Self-pay

## 2021-04-17 ENCOUNTER — Ambulatory Visit: Payer: Self-pay

## 2021-04-18 ENCOUNTER — Other Ambulatory Visit: Payer: Self-pay

## 2021-04-18 ENCOUNTER — Ambulatory Visit: Payer: Self-pay

## 2021-04-18 DIAGNOSIS — M25511 Pain in right shoulder: Secondary | ICD-10-CM

## 2021-04-18 DIAGNOSIS — G8929 Other chronic pain: Secondary | ICD-10-CM

## 2021-04-18 DIAGNOSIS — M25512 Pain in left shoulder: Secondary | ICD-10-CM

## 2021-04-18 DIAGNOSIS — M545 Low back pain, unspecified: Secondary | ICD-10-CM

## 2021-04-18 DIAGNOSIS — M6281 Muscle weakness (generalized): Secondary | ICD-10-CM

## 2021-04-18 NOTE — Therapy (Addendum)
[facility] Outpatient Rehabilitation [facility] [address] [place], [place], [address] Phone: [phone]   Fax:  [phone]  Physical Therapy Treatment/Discharge  Patient Details  Name: Katie Love MRN: [id number] Date of Birth: [birth year] No data recorded  Encounter Date: 04/18/2021   PT End of Session - 04/18/21 1403     Visit Number 3    Number of Visits 12    Date for PT Re-Evaluation 04/27/21    Authorization Type self pay - foto 6th and 10th    PT Start Time 1402    PT Stop Time 1435    PT Time Calculation (min) 33 min    Activity Tolerance Patient tolerated treatment well;No increased pain    Behavior During Therapy [facility] for tasks assessed/performed             Past Medical History:  Diagnosis Date   Medical history non-contributory    No pertinent past medical history     Past Surgical History:  Procedure Laterality Date   CESAREAN SECTION  2008   CESAREAN SECTION N/A 01/02/2013   Procedure: CESAREAN SECTION;  Surgeon: Lesly Dukes, MD;  Location: [facility];  Service: Obstetrics;  Laterality: N/A;   CESAREAN SECTION WITH BILATERAL TUBAL LIGATION N/A 02/27/2018   Procedure: REPEAT CESAREAN SECTION WITH BILATERAL TUBAL LIGATION;  Surgeon: Lesly Dukes, MD;  Location: [facility] BIRTHING SUITES;  Service: Obstetrics;  Laterality: N/A;    There were no vitals filed for this visit.       [facility] PT Assessment - 04/18/21 0001       Observation/Other Assessments   Focus on Therapeutic Outcomes (FOTO)  59% funciton; 65 predicted                           [facility] Adult PT Treatment/Exercise - 04/18/21 0001       Lumbar Exercises: Stretches   Lower Trunk Rotation Limitations x 10 ea side - 5 sec    Prone on Elbows Stretch 60 seconds    Press Ups 5 reps   increased pain     Lumbar Exercises: Seated   Sit to Stand Limitations 2x10      Lumbar Exercises: Supine   Pelvic Tilt 2 reps;10 reps;5 seconds      Shoulder  Exercises: Standing   Row 10 reps    Theraband Level (Shoulder Row) Level 3 (Green)    Row Limitations x 2                    PT Education - 04/18/21 1436     Education Details HEP    Person(s) Educated Patient    Methods Explanation;Demonstration;Handout    Comprehension Verbalized understanding;Returned demonstration                        Plan - 04/18/21 1438     Clinical Impression Statement Pt was able to complete prescribed exercises and demonstrated knowledge of HEP with no adverse effect. After discussion, pt thought it would be best to discharge at this time due to busy schedule and pt feeling as though she has suficiently improved. Her FOTO score increased today compared to evaluation, from 49% to 59%, although it was below her projected LTG. Pt should continue to improve with HEP compliance and is being discharged at this time.    PT Treatment/Interventions ADLs/Self Care Home Management;Iontophoresis 4mg /ml Dexamethasone;Therapeutic exercise;Therapeutic activities;Neuromuscular re-education;Manual techniques;Gait training;Dry needling  PT Home Exercise Plan [email or web address]    Consulted and Agree with Plan of Care Patient             Patient will benefit from skilled therapeutic intervention in order to improve the following deficits and impairments:  Decreased activity tolerance, Decreased range of motion, Decreased strength, Impaired UE functional use, Pain, Decreased mobility  Visit Diagnosis: Right shoulder pain, unspecified chronicity  Chronic left shoulder pain  Low back pain, unspecified back pain laterality, unspecified chronicity, unspecified whether sciatica present  Muscle weakness     Problem List Patient Active Problem List   Diagnosis Date Noted   Status post repeat low transverse cesarean section 02/27/2018   Biological false positive RPR test 10/11/2017   Recurrent vaginitis 03/25/2012    Eloy End, PT,  DPT 04/18/21 2:42 PM  [facility] [address] [place], [place], [address] Phone: [phone]   Fax:  [phone]  Name: Katie Love MRN: [id number] Date of Birth: [birth year]

## 2023-10-17 ENCOUNTER — Telehealth: Payer: Self-pay

## 2023-10-17 ENCOUNTER — Ambulatory Visit
Admission: EM | Admit: 2023-10-17 | Discharge: 2023-10-17 | Disposition: A | Discharge status: Home / Self Care | Payer: BC Managed Care – PPO | Attending: Family Medicine | Admitting: Family Medicine

## 2023-10-17 DIAGNOSIS — Z23 Encounter for immunization: Secondary | ICD-10-CM | POA: Diagnosis not present

## 2023-10-17 DIAGNOSIS — L97511 Non-pressure chronic ulcer of other part of right foot limited to breakdown of skin: Secondary | ICD-10-CM | POA: Diagnosis not present

## 2023-10-17 LAB — CBC WITH DIFFERENTIAL/PLATELET
Basophils Absolute: 0 10*3/uL (ref 0.0–0.2)
Basos: 0 %
EOS (ABSOLUTE): 0 10*3/uL (ref 0.0–0.4)
Eos: 1 %
Hematocrit: 35.2 % (ref 34.0–46.6)
Hemoglobin: 11.5 g/dL (ref 11.1–15.9)
Lymphocytes Absolute: 2.8 10*3/uL (ref 0.7–3.1)
Lymphs: 56 %
MCH: 29.7 pg (ref 26.6–33.0)
MCHC: 32.7 g/dL (ref 31.5–35.7)
MCV: 91 fL (ref 79–97)
Monocytes Absolute: 0.4 10*3/uL (ref 0.1–0.9)
Monocytes: 8 %
Neutrophils Absolute: 1.7 10*3/uL (ref 1.4–7.0)
Neutrophils: 35 %
Platelets: 257 10*3/uL (ref 150–450)
RBC: 3.87 x10E6/uL (ref 3.77–5.28)
RDW: 12.2 % (ref 11.7–15.4)
WBC: 5 10*3/uL (ref 3.4–10.8)

## 2023-10-17 MED ORDER — TETANUS-DIPHTH-ACELL PERTUSSIS 5-2.5-18.5 LF-MCG/0.5 IM SUSY
0.5000 mL | PREFILLED_SYRINGE | Freq: Once | INTRAMUSCULAR | Status: AC
  Administered 2023-10-17: 0.5 mL via INTRAMUSCULAR

## 2023-10-17 NOTE — ED Triage Notes (Signed)
Pt presents to [facility] with co of scrape right bottom of the foot from a metal piece on her ladder 1 week ago. Pt did clean and now has new onset of body aches, pains, cramps and she is concerned for an infection. TDAP may 2019

## 2023-10-17 NOTE — ED Notes (Signed)
Call made for STAT lab pick up. Conf [phone]

## 2023-10-17 NOTE — Telephone Encounter (Signed)
Called Katie Love re stat cbc. Per Trevor Iha FNP, all labs are within normal range. Katie Love verbalized understanding. Call back if any questions or concerns.

## 2023-10-17 NOTE — Discharge Instructions (Addendum)
Advised patient we will follow-up with lab results once returned.  Encouraged to increase daily water intake to see 64 ounces per day 7 days/week.  Advised if symptoms worsen and/or unresolved please follow-up with PCP or [facility] (contact information provided with this AVS today) or here for further evaluation.

## 2023-10-17 NOTE — ED Provider Notes (Signed)
[facility]    CSN: [id number] Arrival date & time: 10/17/23  1105      History   Chief Complaint Chief Complaint  Patient presents with   skin puncture     HPI Katie Love is a 37 y.o. female.   HPI Pleasant 37 year old female presents with concern of right foot skin infection.  Patient reports scraping bottom of right foot on a metal ladder 1 week ago.  Patient reports new onset of bodyaches, myalgias, pains, cramps and is concerned for over possible infection.  PMH significant for obesity and is currently breast-feeding per patient.  Patient request Tdap today and CBC with differential as she is concerned with possible tetanus infection.  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   Recurrent vaginitis 03/25/2012    Past Surgical History:  Procedure Laterality Date   CESAREAN SECTION  2008   CESAREAN SECTION N/A 01/02/2013   Procedure: CESAREAN SECTION;  Surgeon: Lesly Dukes, MD;  Location: [facility];  Service: Obstetrics;  Laterality: N/A;   CESAREAN SECTION WITH BILATERAL TUBAL LIGATION N/A 02/27/2018   Procedure: REPEAT CESAREAN SECTION WITH BILATERAL TUBAL LIGATION;  Surgeon: Lesly Dukes, MD;  Location: [facility] BIRTHING SUIT[facility];  Service: Obstetrics;  Laterality: N/A;    OB History     Gravida  3   Para  3   Term  3   Preterm      AB      Living  3      SAB      IAB      Ectopic      Multiple  0   Live Births  3            Home Medications    Prior to Admission medications   Medication Sig Start Date End Date Taking? Authorizing Provider  cyclobenzaprine (FLEXERIL) 10 MG tablet Take 1 tablet (10 mg total) by mouth at bedtime. Patient not taking: Reported on 03/16/2021 01/06/20   Bing Neighbors, NP  meloxicam (MOBIC) 15 MG tablet Take 1 tablet (15 mg total) by mouth daily. Patient not taking: Reported on 03/16/2021 09/21/20   Mardella Layman, MD  ondansetron (ZOFRAN-ODT) 4 MG disintegrating tablet DISSOLVE 1 TABLET ON TONGUE TWICE A DAY IF NEEDED Patient not taking: Reported on 03/16/2021 08/16/17   [provider]  Prenatal Multivit-Min-Fe-FA (PRENATAL VITAMINS PO) Take 1 tablet by mouth daily.  Patient not taking: Reported on 03/16/2021    [provider]  ferrous sulfate 325 (65 FE) MG tablet Take 1 tablet (325 mg total) by mouth daily. Patient not taking: Reported on 04/17/2018 03/02/18 09/21/20  Montez Morita, CNM    Family History Family History  Problem Relation Age of Onset   Healthy Mother    Healthy Father     Social History Social History   Tobacco Use   Smoking status: Never   Smokeless tobacco: Never  Substance Use Topics   Alcohol use: No   Drug use: No     Allergies   Patient has no known allergies.   Review of Systems Review of Systems  Skin:  Positive for rash and wound.  All other systems reviewed and are negative.    Physical Exam Triage  Vital Signs ED Triage Vitals  Encounter Vitals Group     BP 10/17/23 1119 (!) 160/90     Systolic BP Percentile --      Diastolic BP Percentile --      Pulse Rate 10/17/23 1119 (!) 56     Resp 10/17/23 1119 16     Temp 10/17/23 1119 98.2 F (36.8 C)     Temp src --      SpO2 10/17/23 1119 98 %     Weight --      Height --      Head Circumference --      Peak Flow --      Pain Score 10/17/23 1118 0     Pain Loc --      Pain Education --      Exclude from Growth Chart --    No data found.  Updated Vital Signs BP (!) 142/89   Pulse (!) 56   Temp 98.2 F (36.8 C)   Resp 16   LMP 10/10/2023   SpO2 98%   Visual Acuity Right Eye Distance:   Left Eye Distance:   Bilateral Distance:    Right Eye Near:   Left Eye Near:    Bilateral Near:     Physical  Exam Vitals and nursing note reviewed.  Constitutional:      Appearance: Normal appearance. She is normal weight.  HENT:     Head: Normocephalic and atraumatic.     Mouth/Throat:     Mouth: Mucous membranes are moist.     Pharynx: Oropharynx is clear.  Eyes:     Extraocular Movements: Extraocular movements intact.     Conjunctiva/sclera: Conjunctivae normal.     Pupils: Pupils are equal, round, and reactive to light.  Cardiovascular:     Rate and Rhythm: Normal rate and regular rhythm.     Pulses: Normal pulses.     Heart sounds: Normal heart sounds.  Pulmonary:     Effort: Pulmonary effort is normal.     Breath sounds: Normal breath sounds. No wheezing, rhonchi or rales.  Musculoskeletal:        General: Normal range of motion.     Cervical back: Normal range of motion and neck supple.  Skin:    General: Skin is warm and dry.     Comments: Right foot plantar aspect: Small nylon erythematous ulceration noted-please see image below  Neurological:     General: No focal deficit present.     Mental Status: She is alert and oriented to person, place, and time. Mental status is at baseline.  Psychiatric:        Mood and Affect: Mood normal.        Behavior: Behavior normal.      UC Treatments / Results  Labs (all labs ordered are listed, but only abnormal results are displayed) Labs Reviewed  CBC WITH DIFFERENTIAL/PLATELET    EKG   Radiology No results found.  Procedures Procedures (including critical care time)  Medications Ordered in UC Medications  Tdap (BOOSTRIX) injection 0.5 mL (0.5 mLs Intramuscular Given 10/17/23 1159)    Initial Impression / Assessment and Plan / UC Course  I have reviewed the triage vital signs and the nursing notes.  Pertinent labs & imaging results that were available during my care of the patient were reviewed by me and considered in my medical decision making (see chart for details).     MDM: 1.  Ulcer of right foot, limited to  breakdown of skin-Tdap booster X injection 0.5 mL given once IM prior to discharge per patient request (patient reporting enormous reduction in body aches, myalgias, pains and cramps several minutes after receiving this IM medication), stat CBC with differential ordered. Advised patient we will follow-up with lab results once returned.  Encouraged to increase daily water intake to see 64 ounces per day 7 days/week.  Advised if symptoms worsen and/or unresolved please follow-up with PCP, [facility] (contact information provided with this AVS today) or here for further evaluation.  Patient discharged home, hemodynamically stable. Final Clinical Impressions(s) / UC Diagnoses   Final diagnoses:  Ulcer of right foot, limited to breakdown of skin [facility])     Discharge Instructions      Advised patient we will follow-up with lab results once returned.  Encouraged to increase daily water intake to see 64 ounces per day 7 days/week.  Advised if symptoms worsen and/or unresolved please follow-up with PCP or  Podiatry (contact information provided with this AVS today) or here for further evaluation.     ED Prescriptions   None    PDMP not reviewed this encounter.   Trevor Iha, FNP 10/17/23 1222

## 2023-12-06 ENCOUNTER — Ambulatory Visit
Admission: EM | Admit: 2023-12-06 | Discharge: 2023-12-06 | Disposition: A | Discharge status: Home / Self Care | Payer: PRIVATE HEALTH INSURANCE | Attending: Family Medicine | Admitting: Family Medicine

## 2023-12-06 ENCOUNTER — Other Ambulatory Visit: Payer: Self-pay

## 2023-12-06 ENCOUNTER — Encounter: Payer: Self-pay | Admitting: Emergency Medicine

## 2023-12-06 DIAGNOSIS — Z113 Encounter for screening for infections with a predominantly sexual mode of transmission: Secondary | ICD-10-CM | POA: Insufficient documentation

## 2023-12-06 DIAGNOSIS — L292 Pruritus vulvae: Secondary | ICD-10-CM | POA: Insufficient documentation

## 2023-12-06 DIAGNOSIS — N898 Other specified noninflammatory disorders of vagina: Secondary | ICD-10-CM | POA: Diagnosis not present

## 2023-12-06 DIAGNOSIS — Z202 Contact with and (suspected) exposure to infections with a predominantly sexual mode of transmission: Secondary | ICD-10-CM | POA: Insufficient documentation

## 2023-12-06 MED ORDER — FLUCONAZOLE 200 MG PO TABS: ORAL_TABLET | ORAL | 0 refills | Status: AC

## 2023-12-06 MED ORDER — METRONIDAZOLE 500 MG PO TABS: 500.0000 mg | ORAL_TABLET | Freq: Two times a day (BID) | ORAL | 0 refills | Status: AC

## 2023-12-06 NOTE — ED Triage Notes (Signed)
 Patient c/o vaginal itching and discharge x 3 days.  Denies any odor.  Discharge is white.  Patient did insert a Boric Acid capsule last night.  Would like STI testing no bloodwork.

## 2023-12-06 NOTE — ED Provider Notes (Signed)
 [facility]    CSN: [id number] Arrival date & time: 12/06/23  0803      History   Chief Complaint Chief Complaint  Patient presents with   Vaginal Itching    HPI Katie Love is a 38 y.o. female.   HPI 38 year old female presents with vaginal itching and discharge for 3 days.  Denies vaginal odor.  Reports discharge is white.  Patient reports inserting boric acid suppository capsule last night.  Patient would also like STD testing.  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   Recurrent vaginitis 03/25/2012    Past Surgical History:  Procedure Laterality Date   CESAREAN SECTION  2008   CESAREAN SECTION N/A 01/02/2013   Procedure: CESAREAN SECTION;  Surgeon: Burnard VEAR Pate, MD;  Location: [facility];  Service: Obstetrics;  Laterality: N/A;   CESAREAN SECTION WITH BILATERAL TUBAL LIGATION N/A 02/27/2018   Procedure: REPEAT CESAREAN SECTION WITH BILATERAL TUBAL LIGATION;  Surgeon: Pate Burnard VEAR, MD;  Location: [facility] BIRTHING SUIT[facility];  Service: Obstetrics;  Laterality: N/A;    OB History     Gravida  3   Para  3   Term  3   Preterm      AB      Living  3      SAB      IAB      Ectopic      Multiple  0   Live Births  3            Home Medications    Prior to Admission medications   Medication Sig Start Date End Date Taking? Authorizing Provider  fluconazole  (DIFLUCAN ) 200 MG tablet Take 1 tab p.o. now, may repeat 1 tab p.o. in 3 days if symptoms are not resolved. 12/06/23  Yes Teddy Sharper, FNP  metroNIDAZOLE  (FLAGYL ) 500 MG tablet Take 1 tablet (500 mg total) by mouth 2 (two) times daily. 12/06/23  Yes Teddy Sharper, FNP  cyclobenzaprine  (FLEXERIL ) 10 MG tablet Take 1 tablet (10 mg total) by mouth at  bedtime. Patient not taking: Reported on 03/16/2021 01/06/20   Arloa Suzen RAMAN, NP  meloxicam  (MOBIC ) 15 MG tablet Take 1 tablet (15 mg total) by mouth daily. Patient not taking: Reported on 03/16/2021 09/21/20   Rolinda Rogue, MD  ondansetron  (ZOFRAN -ODT) 4 MG disintegrating tablet DISSOLVE 1 TABLET ON TONGUE TWICE A DAY IF NEEDED Patient not taking: Reported on 03/16/2021 08/16/17   [provider]  Prenatal Multivit-Min-Fe-FA (PRENATAL VITAMINS PO) Take 1 tablet by mouth daily.  Patient not taking: Reported on 03/16/2021    [provider]  ferrous sulfate  325 (65 FE) MG tablet Take 1 tablet (325 mg total) by mouth daily. Patient not taking: Reported on 04/17/2018 03/02/18 09/21/20  Gerlean Earnie BIRCH, CNM    Family History Family History  Problem Relation Age of Onset   Healthy Mother    Healthy Father     Social History Social History   Tobacco Use   Smoking status: Never   Smokeless tobacco: Never  Vaping Use   Vaping status: Never Used  Substance Use Topics   Alcohol use: No   Drug use: No     Allergies   Patient has no known allergies.  Review of Systems Review of Systems  Genitourinary:  Positive for vaginal discharge.       Vaginal itching and discharge (white) x 3 days     Physical Exam Triage Vital Signs ED Triage Vitals  Encounter Vitals Group     BP 12/06/23 0833 110/79     Systolic BP Percentile --      Diastolic BP Percentile --      Pulse Rate 12/06/23 0833 60     Resp 12/06/23 0833 16     Temp 12/06/23 0833 98.7 F (37.1 C)     Temp Source 12/06/23 0833 Oral     SpO2 12/06/23 0833 100 %     Weight --      Height --      Head Circumference --      Peak Flow --      Pain Score 12/06/23 0837 0     Pain Loc --      Pain Education --      Exclude from Growth Chart --    No data found.  Updated Vital Signs BP 110/79 (BP Location: Right Arm)   Pulse 60   Temp 98.7 F (37.1 C) (Oral)   Resp 16   LMP 11/25/2023   SpO2 100%    Breastfeeding No      Physical Exam Vitals and nursing note reviewed.  Constitutional:      Appearance: Normal appearance. She is obese.  HENT:     Head: Normocephalic and atraumatic.     Mouth/Throat:     Mouth: Mucous membranes are moist.     Pharynx: Oropharynx is clear.  Eyes:     Extraocular Movements: Extraocular movements intact.     Conjunctiva/sclera: Conjunctivae normal.     Pupils: Pupils are equal, round, and reactive to light.  Cardiovascular:     Rate and Rhythm: Normal rate and regular rhythm.     Pulses: Normal pulses.     Heart sounds: Normal heart sounds.  Pulmonary:     Effort: Pulmonary effort is normal.     Breath sounds: Normal breath sounds. No wheezing, rhonchi or rales.  Musculoskeletal:        General: Normal range of motion.  Skin:    General: Skin is warm and dry.  Neurological:     General: No focal deficit present.     Mental Status: She is alert and oriented to person, place, and time. Mental status is at baseline.  Psychiatric:        Mood and Affect: Mood normal.        Behavior: Behavior normal.      UC Treatments / Results  Labs (all labs ordered are listed, but only abnormal results are displayed) Labs Reviewed  CERVICOVAGINAL ANCILLARY ONLY    EKG   Radiology No results found.  Procedures Procedures (including critical care time)  Medications Ordered in UC Medications - No data to display  Initial Impression / Assessment and Plan / UC Course  I have reviewed the triage vital signs and the nursing notes.  Pertinent labs & imaging results that were available during my care of the patient were reviewed by me and considered in my medical decision making (see chart for details).     MDM: 1.  Vaginal discharge-Rx'd Flagyl  500 mg tablet: Take 1 tablet twice daily x 7 days, Aptima swab ordered; 2.  Vaginal itching-Rx'd Diflucan  200 mg tablet: Take 1 tablet p.o. now may repeat 1 tab p.o. in 3  days if symptoms are not  resolved, Aptima swab ordered;  3.  Potential exposure to STD-Aptima swab ordered.  Work note provided to patient per her request.  Patient discharged home, hemodynamically stable. Final Clinical Impressions(s) / UC Diagnoses   Final diagnoses:  Vaginal discharge  Vaginal itching  Potential exposure to STD     Discharge Instructions      Instructed patient to take medication as directed with food to completion.  Encouraged to increase daily water intake to 64 ounces per day while taking these medications.  Advised we will follow-up with Aptima swab results once received.  Advised if symptoms worsen and/or unresolved please follow-up with your GYN for further evaluation.     ED Prescriptions     Medication Sig Dispense Auth. Provider   metroNIDAZOLE  (FLAGYL ) 500 MG tablet Take 1 tablet (500 mg total) by mouth 2 (two) times daily. 14 tablet Freedom Peddy, FNP   fluconazole  (DIFLUCAN ) 200 MG tablet Take 1 tab p.o. now, may repeat 1 tab p.o. in 3 days if symptoms are not resolved. 7 tablet Kavina Cantave, FNP      PDMP not reviewed this encounter.   Teddy Sharper, FNP 12/06/23 [phone]

## 2023-12-06 NOTE — Discharge Instructions (Addendum)
 Instructed patient to take medication as directed with food to completion.  Encouraged to increase daily water intake to 64 ounces per day while taking these medications.  Advised we will follow-up with Aptima swab results once received.  Advised if symptoms worsen and/or unresolved please follow-up with your GYN for further evaluation.

## 2023-12-09 LAB — CERVICOVAGINAL ANCILLARY ONLY
Comment: NEGATIVE
Comment: NEGATIVE
Comment: NEGATIVE
Comment: NEGATIVE
Comment: NEGATIVE
Comment: NORMAL

## 2023-12-25 ENCOUNTER — Ambulatory Visit: Payer: Self-pay

## 2023-12-25 ENCOUNTER — Ambulatory Visit
Admission: RE | Admit: 2023-12-25 | Discharge: 2023-12-25 | Disposition: A | Discharge status: Home / Self Care | Payer: PRIVATE HEALTH INSURANCE | Source: Ambulatory Visit | Attending: Family Medicine | Admitting: Family Medicine

## 2023-12-25 VITALS — BP 126/75 | Temp 100.6°F | Resp 20

## 2023-12-25 DIAGNOSIS — R509 Fever, unspecified: Secondary | ICD-10-CM | POA: Diagnosis not present

## 2023-12-25 DIAGNOSIS — J111 Influenza due to unidentified influenza virus with other respiratory manifestations: Secondary | ICD-10-CM | POA: Diagnosis not present

## 2023-12-25 LAB — POCT INFLUENZA A/B
Influenza A, POC: NEGATIVE
Influenza B, POC: NEGATIVE

## 2023-12-25 LAB — RESP PANEL BY RT-PCR (RSV, FLU A&B, COVID)  RVPGX2
Influenza A by PCR: NEGATIVE
Influenza B by PCR: NEGATIVE
Resp Syncytial Virus by PCR: NEGATIVE
SARS Coronavirus 2 by RT PCR: NEGATIVE

## 2023-12-25 MED ORDER — OSELTAMIVIR PHOSPHATE 75 MG PO CAPS: 75.0000 mg | ORAL_CAPSULE | Freq: Two times a day (BID) | ORAL | 0 refills | Status: AC

## 2023-12-25 NOTE — ED Provider Notes (Signed)
 [facility]    CSN: [id number] Arrival date & time: 12/25/23  1121      History   Chief Complaint Chief Complaint  Patient presents with   Fever    Entered by patient   Foreign Body in Ear   Generalized Body Aches    HPI Katie Love is a 38 y.o. female.   HPI 38 year old female presents with fever.  PMH significant for obesity and recurrent vaginitis.  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   Recurrent vaginitis 03/25/2012    Past Surgical History:  Procedure Laterality Date   CESAREAN SECTION  2008   CESAREAN SECTION N/A 01/02/2013   Procedure: CESAREAN SECTION;  Surgeon: Lesly Dukes, MD;  Location: [facility];  Service: Obstetrics;  Laterality: N/A;   CESAREAN SECTION WITH BILATERAL TUBAL LIGATION N/A 02/27/2018   Procedure: REPEAT CESAREAN SECTION WITH BILATERAL TUBAL LIGATION;  Surgeon: Lesly Dukes, MD;  Location: [facility]IRTH[facility] SUIT[facility];  Service: Obstetrics;  Laterality: N/A;    OB History     Gravida  3   Para  3   Term  3   Preterm      AB      Living  3      SAB      IAB      Ectopic      Multiple  0   Live Births  3            Home Medications    Prior to Admission medications   Medication Sig Start Date End Date Taking? Authorizing Provider  oseltamivir (TAMIFLU) 75 MG capsule Take 1 capsule (75 mg total) by mouth every 12 (twelve) hours. 12/25/23  Yes Trevor Iha, FNP  fluconazole (DIFLUCAN) 200 MG tablet Take 1 tab p.o. now, may repeat 1 tab p.o. in 3 days if symptoms are not resolved. 12/06/23   Trevor Iha, FNP  metroNIDAZOLE (FLAGYL) 500 MG tablet Take 1 tablet (500 mg total) by mouth 2 (two) times daily. 12/06/23   Trevor Iha, FNP  ondansetron (ZOFRAN-ODT) 4 MG  disintegrating tablet DISSOLVE 1 TABLET ON TONGUE TWICE A DAY IF NEEDED Patient not taking: Reported on 03/16/2021 08/16/17   [provider]  Prenatal Multivit-Min-Fe-FA (PRENATAL VITAMINS PO) Take 1 tablet by mouth daily.  Patient not taking: Reported on 03/16/2021    [provider]  ferrous sulfate 325 (65 FE) MG tablet Take 1 tablet (325 mg total) by mouth daily. Patient not taking: Reported on 04/17/2018 03/02/18 09/21/20  Montez Morita, CNM    Family History Family History  Problem Relation Age of Onset   Healthy Mother    Healthy Father     Social History Social History   Tobacco Use   Smoking status: Never   Smokeless tobacco: Never  Vaping Use   Vaping status: Never Used  Substance Use Topics   Alcohol use: No   Drug use: No     Allergies   Patient has no known allergies.   Review of Systems Review of Systems   Physical Exam Triage Vital Signs ED Triage Vitals  Encounter Vitals Group     BP      Systolic BP Percentile      Diastolic BP  Percentile      Pulse      Resp      Temp      Temp src      SpO2      Weight      Height      Head Circumference      Peak Flow      Pain Score      Pain Loc      Pain Education      Exclude from Growth Chart    No data found.  Updated Vital Signs BP 126/75   Temp (!) 100.6 F (38.1 C) (Oral)   Resp 20   LMP 11/25/2023   SpO2 98%      Physical Exam Vitals and nursing note reviewed.  Constitutional:      General: She is not in acute distress.    Appearance: Normal appearance. She is obese. She is ill-appearing.  HENT:     Head: Normocephalic and atraumatic.     Right Ear: Tympanic membrane, ear canal and external ear normal.     Left Ear: Tympanic membrane, ear canal and external ear normal.     Mouth/Throat:     Mouth: Mucous membranes are moist.     Pharynx: Oropharynx is clear.  Eyes:     Extraocular Movements: Extraocular movements intact.     Conjunctiva/sclera:  Conjunctivae normal.     Pupils: Pupils are equal, round, and reactive to light.  Cardiovascular:     Rate and Rhythm: Normal rate and regular rhythm.     Pulses: Normal pulses.     Heart sounds: Normal heart sounds. No murmur heard. Pulmonary:     Effort: Pulmonary effort is normal.     Breath sounds: Normal breath sounds. No wheezing, rhonchi or rales.  Musculoskeletal:        General: Normal range of motion.     Cervical back: Normal range of motion and neck supple.  Skin:    General: Skin is warm and dry.  Neurological:     General: No focal deficit present.     Mental Status: She is alert and oriented to person, place, and time. Mental status is at baseline.  Psychiatric:        Mood and Affect: Mood normal.        Behavior: Behavior normal.      UC Treatments / Results  Labs (all labs ordered are listed, but only abnormal results are displayed) Labs Reviewed  POCT INFLUENZA A/B - Normal  RESP PANEL BY RT-PCR (RSV, FLU A&B, COVID)  [id number]  POC SARS CORONAVIRUS 2 AG -  ED    EKG   Radiology No results found.  Procedures Procedures (including critical care time)  Medications Ordered in UC Medications - No data to display  Initial Impression / Assessment and Plan / UC Course  I have reviewed the triage vital signs and the nursing notes.  Pertinent labs & imaging results that were available during my care of the patient were reviewed by me and considered in my medical decision making (see chart for details).     MDM: 1.  Influenza-like illness-influenza/COVID-19 negative, will treat empirically with Tamiflu.  Rx'd Tamiflu 75 mg capsule: Take 1 capsule twice daily x 5 days.  Respiratory panel ordered; 2. Advised patient may take OTC Tylenol 1 g every 6 hours for fever (oral temperature greater than 100.3). Advised patient to take medication as directed with food to completion.  Advised patient may take  OTC Tylenol 1 g every 6 hours for fever (oral temperature  greater than 100.3).  Encouraged to increase daily water intake to 64 ounces per day while taking these medications.  Advised if symptoms worsen and/or unresolved please follow-up with your PCP or here for further evaluation.  Advised we will follow-up with respiratory panel results once received.  Work note provided to patient for 1 week per her request. Final Clinical Impressions(s) / UC Diagnoses   Final diagnoses:  Influenza-like illness  Fever, unspecified     Discharge Instructions      Advised patient to take medication as directed with food to completion.  Advised patient may take OTC Tylenol 1 g every 6 hours for fever (oral temperature greater than 100.3).  Encouraged to increase daily water intake to 64 ounces per day while taking these medications.  Advised if symptoms worsen and/or unresolved please follow-up with your PCP or here for further evaluation.  Advised we will follow-up with respiratory panel results once received.     ED Prescriptions     Medication Sig Dispense Auth. Provider   oseltamivir (TAMIFLU) 75 MG capsule Take 1 capsule (75 mg total) by mouth every 12 (twelve) hours. 10 capsule Trevor Iha, FNP      PDMP not reviewed this encounter.   Trevor Iha, FNP 12/25/23 1253

## 2023-12-25 NOTE — Discharge Instructions (Addendum)
 Advised patient to take medication as directed with food to completion.  Advised patient may take OTC Tylenol 1 g every 6 hours for fever (oral temperature greater than 100.3).  Encouraged to increase daily water intake to 64 ounces per day while taking these medications.  Advised if symptoms worsen and/or unresolved please follow-up with your PCP or here for further evaluation.  Advised we will follow-up with respiratory panel results once received.

## 2023-12-25 NOTE — ED Triage Notes (Signed)
 Pt presents to [facility] with co of fever, sore throat and body aches for one day. Pt has taken tylenol and motrin and vit c  otc.
